# Patient Record
Sex: Female | Born: 1990 | Race: Black or African American | Hispanic: No | Marital: Married | Smoking: Former smoker
Health system: Southern US, Community
[De-identification: ages and names within clinical notes are randomized; demographics above are authoritative.]

## PROBLEM LIST (undated history)

## (undated) ENCOUNTER — Inpatient Hospital Stay (HOSPITAL_COMMUNITY): Payer: Self-pay

## (undated) DIAGNOSIS — Z789 Other specified health status: Secondary | ICD-10-CM

---

## 2015-04-27 DIAGNOSIS — Z98891 History of uterine scar from previous surgery: Secondary | ICD-10-CM | POA: Insufficient documentation

## 2015-10-30 DIAGNOSIS — O99820 Streptococcus B carrier state complicating pregnancy: Secondary | ICD-10-CM | POA: Insufficient documentation

## 2016-08-04 ENCOUNTER — Emergency Department (HOSPITAL_COMMUNITY)
Admission: EM | Admit: 2016-08-04 | Discharge: 2016-08-04 | Disposition: A | Discharge status: Home / Self Care | Payer: Medicaid Other | Attending: Dermatology | Admitting: Dermatology

## 2016-08-04 ENCOUNTER — Encounter (HOSPITAL_COMMUNITY): Payer: Self-pay | Admitting: Emergency Medicine

## 2016-08-04 DIAGNOSIS — Y929 Unspecified place or not applicable: Secondary | ICD-10-CM | POA: Insufficient documentation

## 2016-08-04 DIAGNOSIS — Z5321 Procedure and treatment not carried out due to patient leaving prior to being seen by health care provider: Secondary | ICD-10-CM | POA: Insufficient documentation

## 2016-08-04 DIAGNOSIS — M25551 Pain in right hip: Secondary | ICD-10-CM | POA: Insufficient documentation

## 2016-08-04 DIAGNOSIS — W19XXXA Unspecified fall, initial encounter: Secondary | ICD-10-CM | POA: Insufficient documentation

## 2016-08-04 DIAGNOSIS — Y939 Activity, unspecified: Secondary | ICD-10-CM | POA: Insufficient documentation

## 2016-08-04 DIAGNOSIS — Y999 Unspecified external cause status: Secondary | ICD-10-CM | POA: Diagnosis not present

## 2016-08-04 NOTE — ED Triage Notes (Signed)
Pt complaint of right hip pain post fall 2200 yesterday post mopping stairs. Pt ambulatory to triage.

## 2016-08-04 NOTE — ED Notes (Addendum)
Patient states she no longer wants to wait to be seen. Patient left hospital.

## 2016-11-12 ENCOUNTER — Emergency Department (HOSPITAL_COMMUNITY)
Admission: EM | Admit: 2016-11-12 | Discharge: 2016-11-12 | Disposition: A | Discharge status: Home / Self Care | Payer: Medicaid Other | Attending: Emergency Medicine | Admitting: Emergency Medicine

## 2016-11-12 ENCOUNTER — Emergency Department (HOSPITAL_COMMUNITY): Payer: Medicaid Other

## 2016-11-12 ENCOUNTER — Encounter (HOSPITAL_COMMUNITY): Payer: Self-pay | Admitting: Emergency Medicine

## 2016-11-12 DIAGNOSIS — Y929 Unspecified place or not applicable: Secondary | ICD-10-CM | POA: Diagnosis not present

## 2016-11-12 DIAGNOSIS — Y999 Unspecified external cause status: Secondary | ICD-10-CM | POA: Insufficient documentation

## 2016-11-12 DIAGNOSIS — Y939 Activity, unspecified: Secondary | ICD-10-CM | POA: Insufficient documentation

## 2016-11-12 DIAGNOSIS — X501XXA Overexertion from prolonged static or awkward postures, initial encounter: Secondary | ICD-10-CM | POA: Insufficient documentation

## 2016-11-12 DIAGNOSIS — M25561 Pain in right knee: Secondary | ICD-10-CM | POA: Diagnosis not present

## 2016-11-12 DIAGNOSIS — F17210 Nicotine dependence, cigarettes, uncomplicated: Secondary | ICD-10-CM | POA: Insufficient documentation

## 2016-11-12 MED ORDER — ACETAMINOPHEN 500 MG PO TABS
1000.0000 mg | ORAL_TABLET | Freq: Once | ORAL | Status: AC
  Administered 2016-11-12: 1000 mg via ORAL
  Filled 2016-11-12: qty 2

## 2016-11-12 NOTE — ED Notes (Signed)
Returned from xray

## 2016-11-12 NOTE — ED Triage Notes (Signed)
Pt sts right knee pain and "popping" x 3 days

## 2016-11-12 NOTE — Discharge Instructions (Signed)
Your knee x-rays did not show fractures of dislocations.  I suspect you temporarily dislocated your knee cap, hearing your history and exam findings.   Please take 600 mg ibuprofen for pain every 6-8 hours.  If you need additional pain control, please take 1000  mg of tylenol additionally, every 6-8 hours.  Ice. Rest. Elevate. Avoid activities that exacerbate the pain.  Wear your knee brace to provide support.    Contact orthopedist for an appointment within 7-10 days if your knee continues to pop out or causes you pain.  The orthopedist may recommend an MRI for further work up.  Return to the ED if your pain worsens and prohibits your ability to walk, you have numbness or weakness in your lower leg, have fever, rash or significant swelling of your joint.

## 2016-11-12 NOTE — ED Provider Notes (Signed)
[facility] Provider Note   CSN: [id number] Arrival date & time: 11/12/16  2130   By signing my name below, I, Paula Patton, attest that this documentation has been prepared under the direction and in the presence of Paula Heck, PA-C. Electronically Signed: Clarisse Patton, Scribe. 11/12/16. 10:51 AM.   History   Chief Complaint Chief Complaint  Patient presents with  . Knee Pain   The history is provided by the patient and medical records. No language interpreter was used.    Paula Patton is a 26 y.o. female with no pertinent PMHx on file, transported via private vehicle to the Emergency Department with concern for R knee pain s/p twisting motion 3 days ago. States her knee popped out of place laterally when she twisted from a planted position, causing her to fall and her knee stayed out of place for 1-2 minutes before returning to its original position without intervention. Pt adds her knee has popped out of place 2 times since then. Associated knee swelling, occasional R knee pain radiates to leg and foot with tingling and sensation of instability while walking. She describes waxing and waning, throbbing, aching pain from the knee down, worsened with extension of the leg and ambulation. Pain mildly relieved with ice and ibuprofen, ibuprofen last taken ~ 6:30 AM this morning. States right knee has popped out of place once before 1 year ago with no complication noted since. Pt notes she takes depo-provera shots. No leg swelling, calf pain or swelling, h/o trauma to the area or any other complaints noted at this time.    History reviewed. No pertinent past medical history.  There are no active problems to display for this patient.   History reviewed. No pertinent surgical history.  OB History    No data available       Home Medications    Prior to Admission medications   Not on File    Family History History reviewed. No pertinent family history.  Social  History Social History  Substance Use Topics  . Smoking status: Current Every Day Smoker    Packs/day: 0.50    Types: Cigarettes  . Smokeless tobacco: Not on file  . Alcohol use No     Allergies   Patient has no known allergies.   Review of Systems Review of Systems  Cardiovascular: Negative for leg swelling.  Musculoskeletal: Positive for arthralgias and joint swelling.  Skin: Negative for wound.  Neurological:       +paresthesias in R foot   All other systems reviewed and are negative.    Physical Exam Updated Vital Signs BP (!) 124/94 (BP Location: Left Arm)   Pulse (!) 109   Temp 98.6 F (37 C) (Oral)   Resp 20   SpO2 98%   Physical Exam  Constitutional: She is oriented to person, place, and time. She appears well-developed and well-nourished.  HENT:  Head: Normocephalic and atraumatic.  Eyes: Conjunctivae are normal. Pupils are equal, round, and reactive to light. Right eye exhibits no discharge. Left eye exhibits no discharge. No scleral icterus.  Neck: Normal range of motion. No JVD present. No tracheal deviation present.  Pulmonary/Chest: Effort normal. No stridor.  Musculoskeletal: She exhibits edema and tenderness.  RIGHT KNEE +Mild medial knee edema without erythema, fluctuance or signs of cellulitis.  + Full passive ROM of knees with normal patellar J tracking bilaterally, pain with knee flexion and extension +Medial joint line tenderness +Tenderness over MCL + Positive McMurray's (medial and lateral) +  Varus laxity  No lateral joint line tenderness.   No bony tenderness over fibular head or tibial tuberosity.   No tenderness over LCL or quadriceps tendon.    Negative Lachman's.  Negative posterior drawer test.   Negative ballottement test.  No valgus laxity.  No crepitus with knee ROM.  Patient able to bear weight in ED (4+ steps)  Neurological: She is alert and oriented to person, place, and time. Coordination normal.  5/5 strength with knee  extension and flexion 5/5 strength with ankle dorsiflexion and plantar flexion Sensation to light touch intact in the distribution of the femoral nerve, common fibular nerve.   Psychiatric: She has a normal mood and affect. Her behavior is normal. Judgment and thought content normal.  Nursing note and vitals reviewed.    ED Treatments / Results  DIAGNOSTIC STUDIES: Oxygen Saturation is 98% on RA, NL by my interpretation.    COORDINATION OF CARE: 11:34 AM-Discussed next steps with pt. Pt verbalized understanding and is agreeable with the plan. Will order imaging.   Labs (all labs ordered are listed, but only abnormal results are displayed) Labs Reviewed - No data to display  EKG  EKG Interpretation None       Radiology Dg Knee Complete 4 Views Right  Result Date: 11/12/2016 CLINICAL DATA:  Knee popped out of place 3 times.  Right knee pain. EXAM: RIGHT KNEE - COMPLETE 4+ VIEW COMPARISON:  None. FINDINGS: No evidence of fracture, dislocation, or joint effusion. The patella appears normally located on the submitted views. IMPRESSION: Negative. Electronically Signed   By: Marnee Spring M.D.   On: 11/12/2016 11:22    Procedures Procedures (including critical care time)  Medications Ordered in ED Medications  acetaminophen (TYLENOL) tablet 1,000 mg (1,000 mg Oral Given 11/12/16 1103)     Initial Impression / Assessment and Plan / ED Course  I have reviewed the triage vital signs and the nursing notes.  Pertinent labs & imaging results that were available during my care of the patient were reviewed by me and considered in my medical decision making (see chart for details).  Clinical Course as of Nov 12 1132  Wed Nov 12, 2016  1126 FINDINGS: No evidence of fracture, dislocation, or joint effusion. The patella appears normally located on the submitted views. DG Knee Complete 4 Views Right [CG]    Clinical Course User Index [CG] Liberty Handy, PA-C    26 yo female  presents to ED with acute right knee pain s/p sensation of "knee popping out of place", this has happened 3 times in the last 3 days.  Patient has h/o of same one year ago, but pain was not this severe.  On exam there is tenderness to patella, MCL and medial joint line. Slight varus laxity.  Positive McMurray's medially and laterally.  No overlaying skin changes to suggest septic arthritis, cellulitis, osteomyelitis or gout.  Lower extremity is neurovascularly intact.  X-rays negative today.  Suspect soft tissue injury, will likely need MRI if symptoms progress or pain is not improved.  Will discharge with NSAIDs, ice, rest, elevate and orthopedic f/u.  Discussed x-rays results with patient who is agreeable to plan.   Final Clinical Impressions(s) / ED Diagnoses   Final diagnoses:  Acute pain of right knee    New Prescriptions New Prescriptions   No medications on file   I personally performed the services described in this documentation, which was scribed in my presence. The recorded information has been reviewed  and is accurate.    Liberty Handy, PA-C 11/12/16 1134    Raeford Razor, MD 11/19/16 [phone]

## 2016-11-12 NOTE — ED Notes (Signed)
Patient transported to X-ray 

## 2016-12-17 ENCOUNTER — Emergency Department
Admission: EM | Admit: 2016-12-17 | Discharge: 2016-12-17 | Disposition: A | Discharge status: Home / Self Care | Payer: Medicaid Other | Attending: Emergency Medicine | Admitting: Emergency Medicine

## 2016-12-17 ENCOUNTER — Encounter: Payer: Self-pay | Admitting: *Deleted

## 2016-12-17 ENCOUNTER — Emergency Department: Payer: Medicaid Other

## 2016-12-17 DIAGNOSIS — M25561 Pain in right knee: Secondary | ICD-10-CM

## 2016-12-17 DIAGNOSIS — Y999 Unspecified external cause status: Secondary | ICD-10-CM | POA: Insufficient documentation

## 2016-12-17 DIAGNOSIS — Y929 Unspecified place or not applicable: Secondary | ICD-10-CM | POA: Insufficient documentation

## 2016-12-17 DIAGNOSIS — Y939 Activity, unspecified: Secondary | ICD-10-CM | POA: Diagnosis not present

## 2016-12-17 DIAGNOSIS — S83001A Unspecified subluxation of right patella, initial encounter: Secondary | ICD-10-CM | POA: Insufficient documentation

## 2016-12-17 DIAGNOSIS — G8929 Other chronic pain: Secondary | ICD-10-CM | POA: Diagnosis not present

## 2016-12-17 DIAGNOSIS — X509XXA Other and unspecified overexertion or strenuous movements or postures, initial encounter: Secondary | ICD-10-CM | POA: Diagnosis not present

## 2016-12-17 DIAGNOSIS — F17210 Nicotine dependence, cigarettes, uncomplicated: Secondary | ICD-10-CM | POA: Insufficient documentation

## 2016-12-17 MED ORDER — NAPROXEN 500 MG PO TABS
500.0000 mg | ORAL_TABLET | Freq: Once | ORAL | Status: AC
  Administered 2016-12-17: 500 mg via ORAL

## 2016-12-17 MED ORDER — NAPROXEN 500 MG PO TABS: 500.0000 mg | ORAL_TABLET | Freq: Two times a day (BID) | ORAL | Status: DC

## 2016-12-17 MED ORDER — NAPROXEN 500 MG PO TABS
ORAL_TABLET | ORAL | Status: AC
  Filled 2016-12-17: qty 1

## 2016-12-17 NOTE — ED Triage Notes (Addendum)
Pt reports her right knee was dislocated 3 weeks ago and today right knee popped out again and caused pain.  Pt ambulates with a limp.  Pt requesting a mri.

## 2016-12-17 NOTE — ED Provider Notes (Signed)
[facility] Emergency Department Provider Note   ____________________________________________   None    (approximate)  I have reviewed the triage vital signs and the nursing notes.   HISTORY  Chief Complaint Knee Pain    HPI Paula Patton is a 26 y.o. female patient complaining of continue right knee pain status post subluxation of patella 3 weeks ago. Patient state continues to walk with atypical gait.patient rates the pain as a 7/10. Patient described a pain as "achy".   No past medical history on file.  There are no active problems to display for this patient.   No past surgical history on file.  Prior to Admission medications   Not on File    Allergies Patient has no known allergies.  No family history on file.  Social History Social History  Substance Use Topics  . Smoking status: Current Every Day Smoker    Packs/day: 0.50    Types: Cigarettes  . Smokeless tobacco: Never Used  . Alcohol use No    Review of Systems  Constitutional: No fever/chills Eyes: No visual changes. ENT: No sore throat. Cardiovascular: Denies chest pain. Respiratory: Denies shortness of breath. Gastrointestinal: No abdominal pain.  No nausea, no vomiting.  No diarrhea.  No constipation. Genitourinary: Negative for dysuria. Musculoskeletal: right knee pain Skin: Negative for rash. Neurological: Negative for headaches, focal weakness or numbness.   ____________________________________________   PHYSICAL EXAM:  VITAL SIGNS: ED Triage Vitals  Enc Vitals Group     BP 12/17/16 1939 116/68     Pulse Rate 12/17/16 1939 78     Resp 12/17/16 1939 18     Temp 12/17/16 1939 98.4 F (36.9 C)     Temp Source 12/17/16 1939 Oral     SpO2 12/17/16 1939 99 %     Weight 12/17/16 1936 140 lb (63.5 kg)     Height 12/17/16 1936 5' (1.524 m)     Head Circumference --      Peak Flow --      Pain Score 12/17/16 1936 7     Pain Loc --      Pain Edu? --        Excl. in GC? --     Constitutional: Alert and oriented. Well appearing and in no acute distress. Eyes: Conjunctivae are normal. PERRL. EOMI. Head: Atraumatic. Nose: No congestion/rhinnorhea. Mouth/Throat: Mucous membranes are moist.  Oropharynx non-erythematous. Neck: No stridor.  No cervical spine tenderness to palpation. Hematological/Lymphatic/Immunilogical: No cervical lymphadenopathy. Cardiovascular: Normal rate, regular rhythm. Grossly normal heart sounds.  Good peripheral circulation. Respiratory: Normal respiratory effort.  No retractions. Lungs CTAB. Gastrointestinal: Soft and nontender. No distention. No abdominal bruits. No CVA tenderness. Musculoskeletal: no obvious deformity to the right knee. Patient has mild crepitus to palpation. Patient has LCL laxity with stress testing. Neurologic:  Normal speech and language. No gross focal neurologic deficits are appreciated. No gait instability. Skin:  Skin is warm, dry and intact. No rash noted. Psychiatric: Mood and affect are normal. Speech and behavior are normal.  ____________________________________________   LABS (all labs ordered are listed, but only abnormal results are displayed)  Labs Reviewed - No data to display ____________________________________________  EKG   ____________________________________________  RADIOLOGY  CT shows slight subluxation without frank dislocation the right patella. ____________________________________________   PROCEDURES  Procedure(s) performed: None  Procedures  Critical Care performed: No  ____________________________________________   INITIAL IMPRESSION / ASSESSMENT AND PLAN / ED COURSE  Pertinent labs & imaging results that were  available during my care of the patient were reviewed by me and considered in my medical decision making (see chart for details).  Chronic right knee pain secondary to slight subluxation of the patella without frank dislocation.  Discussed x-ray finding with patient. Patient advised follow-up with orthopedics at home's dictation for definitive evaluation and treatment.      ____________________________________________   FINAL CLINICAL IMPRESSION(S) / ED DIAGNOSES  Final diagnoses:  Chronic pain of right knee      NEW MEDICATIONS STARTED DURING THIS VISIT:  New Prescriptions   No medications on file     Note:  This document was prepared using Dragon voice recognition software and may include unintentional dictation errors.    Joni ReiningSmith, Aiya Keach K, PA-C 12/17/16 2138    Rockne MenghiniNorman, Anne-Caroline, MD 12/17/16 [phone]

## 2016-12-17 NOTE — Discharge Instructions (Signed)
Wear knee support until evaluation by orthopedics.

## 2017-01-22 ENCOUNTER — Ambulatory Visit (HOSPITAL_COMMUNITY)
Admission: EM | Admit: 2017-01-22 | Discharge: 2017-01-22 | Disposition: A | Discharge status: Home / Self Care | Payer: Medicaid Other | Attending: Family Medicine | Admitting: Family Medicine

## 2017-01-22 ENCOUNTER — Encounter (HOSPITAL_COMMUNITY): Payer: Self-pay | Admitting: Family Medicine

## 2017-01-22 DIAGNOSIS — R319 Hematuria, unspecified: Secondary | ICD-10-CM | POA: Diagnosis not present

## 2017-01-22 DIAGNOSIS — N390 Urinary tract infection, site not specified: Secondary | ICD-10-CM | POA: Diagnosis not present

## 2017-01-22 LAB — POCT URINALYSIS DIP (DEVICE)
GLUCOSE, UA: 100 mg/dL — AB
Nitrite: NEGATIVE
Specific Gravity, Urine: >=1.03 (ref 1.005–1.030)
Urobilinogen, UA: 1 mg/dL (ref 0.0–1.0)
pH: 6.5 (ref 5.0–8.0)

## 2017-01-22 LAB — POCT PREGNANCY, URINE: Preg Test, Ur: NEGATIVE

## 2017-01-22 MED ORDER — SULFAMETHOXAZOLE-TRIMETHOPRIM 800-160 MG PO TABS: 1.0000 | ORAL_TABLET | Freq: Two times a day (BID) | ORAL | 0 refills | Status: AC

## 2017-01-22 MED ORDER — PHENAZOPYRIDINE HCL 200 MG PO TABS: 200.0000 mg | ORAL_TABLET | Freq: Three times a day (TID) | ORAL | 0 refills | Status: DC

## 2017-01-22 NOTE — ED Triage Notes (Signed)
Pt here for urinary frequency, burning and hematuria,

## 2017-01-22 NOTE — ED Provider Notes (Signed)
  [facility]    ASSESSMENT & PLAN:  Today you were diagnosed with the following: 1. Urinary tract infection with hematuria, site unspecified    You have been prescribed prescription medications this visit.   Meds ordered this encounter  Medications  . sulfamethoxazole-trimethoprim (BACTRIM DS,SEPTRA DS) 800-160 MG tablet    Sig: Take 1 tablet by mouth 2 (two) times daily.    Dispense:  10 tablet    Refill:  0  . phenazopyridine (PYRIDIUM) 200 MG tablet    Sig: Take 1 tablet (200 mg total) by mouth 3 (three) times daily.    Dispense:  6 tablet    Refill:  0   If you are not improving over the next few days or feel you are worsening please follow up here or the Emergency Department if you are unable to see your regular doctor.  We have send a urine culture and will contact you when the results are available. Please do your best to ensure adequate fluid intake.  If you are not improving over the next few days or feel you are worsening please follow up here or the Emergency Department if you are unable to see your regular doctor.  Outlined signs and symptoms indicating need for more acute intervention. Call or return to clinic if these symptoms worsen or fail to improve as anticipated. Patient verbalized understanding. After Visit Summary given.  SUBJECTIVE:  Paula Patton is a 26 y.o. female who complains of urinary frequency, urgency and dysuria for the past 1 day, without flank pain, fever, chills, abnormal vaginal discharge or bleeding. Does report blood in urine. Normal PO intake. No flank or abdominal pain. No self treatment.  OBJECTIVE:  Vitals:   01/22/17 1510  BP: 106/70  Pulse: 86  Resp: 17  Temp: 98.4 F (36.9 C)  SpO2: 100%   Appears well, in no apparent distress. Abdomen is soft without tenderness, guarding, mass, rebound or organomegaly. No CVA tenderness or inguinal adenopathy noted.  Labs Reviewed  POCT URINALYSIS DIP (DEVICE) - Abnormal;  Notable for the following:       Result Value   Glucose, UA 100 (*)    Bilirubin Urine SMALL (*)    Ketones, ur TRACE (*)    Hgb urine dipstick LARGE (*)    Protein, ur >=300 (*)    Leukocytes, UA LARGE (*)    All other components within normal limits   PMH reviewed.    Mardella LaymanHagler, Paula Hall, MD 01/22/17 [phone]

## 2017-01-22 NOTE — Discharge Instructions (Addendum)
Today you were diagnosed with the following: 1. Urinary tract infection with hematuria, site unspecified    You have been prescribed prescription medications this visit.   Meds ordered this encounter  Medications   sulfamethoxazole-trimethoprim (BACTRIM DS,SEPTRA DS) 800-160 MG tablet    Sig: Take 1 tablet by mouth 2 (two) times daily.    Dispense:  10 tablet    Refill:  0   phenazopyridine (PYRIDIUM) 200 MG tablet    Sig: Take 1 tablet (200 mg total) by mouth 3 (three) times daily.    Dispense:  6 tablet    Refill:  0   We will call you with the results of your urine culture.  If you are not improving over the next few days or feel you are worsening please follow up here or the Emergency Department if you are unable to see your regular doctor.

## 2017-08-21 ENCOUNTER — Emergency Department (HOSPITAL_BASED_OUTPATIENT_CLINIC_OR_DEPARTMENT_OTHER)
Admit: 2017-08-21 | Discharge: 2017-08-21 | Disposition: A | Discharge status: Home / Self Care | Payer: 59 | Attending: Emergency Medicine | Admitting: Emergency Medicine

## 2017-08-21 ENCOUNTER — Emergency Department (HOSPITAL_COMMUNITY)
Admission: EM | Admit: 2017-08-21 | Discharge: 2017-08-21 | Disposition: A | Discharge status: Home / Self Care | Payer: 59 | Attending: Emergency Medicine | Admitting: Emergency Medicine

## 2017-08-21 ENCOUNTER — Encounter (HOSPITAL_COMMUNITY): Payer: Self-pay | Admitting: Emergency Medicine

## 2017-08-21 ENCOUNTER — Emergency Department (HOSPITAL_COMMUNITY): Payer: 59

## 2017-08-21 ENCOUNTER — Other Ambulatory Visit: Payer: Self-pay

## 2017-08-21 DIAGNOSIS — Z79899 Other long term (current) drug therapy: Secondary | ICD-10-CM | POA: Insufficient documentation

## 2017-08-21 DIAGNOSIS — M79609 Pain in unspecified limb: Secondary | ICD-10-CM | POA: Diagnosis not present

## 2017-08-21 DIAGNOSIS — F17210 Nicotine dependence, cigarettes, uncomplicated: Secondary | ICD-10-CM | POA: Diagnosis not present

## 2017-08-21 DIAGNOSIS — M25561 Pain in right knee: Secondary | ICD-10-CM | POA: Insufficient documentation

## 2017-08-21 MED ORDER — NAPROXEN 500 MG PO TABS: 500.0000 mg | ORAL_TABLET | Freq: Two times a day (BID) | ORAL | Status: DC

## 2017-08-21 MED ORDER — NAPROXEN 250 MG PO TABS
500.0000 mg | ORAL_TABLET | Freq: Once | ORAL | Status: AC
  Administered 2017-08-21: 500 mg via ORAL
  Filled 2017-08-21: qty 2

## 2017-08-21 NOTE — Discharge Instructions (Signed)
You were seen in the emergency department for pain and swelling in your right knee.  Your x-ray did not show any fracture or dislocation. Your ultrasound did not show a blood clot in your leg.   I have prescribed you naproxen this is a nonsteroidal anti-inflammatory medication.  You may take this once as needed every 12 hours for pain and swelling, be sure to take this with food as it can cause stomach upset and at worst stomach bleeding.  Do not take other nonsteroidal anti-inflammatories while taking this medicine including Goody powder, Advil, Aleve, or Motrin.  You may supplement with Tylenol.   Follow-up with your orthopedic doctor or the orthopedic doctor that I have provided in your discharge instructions within 1 week if you have not had improvement in your symptoms.  Return to the emergency department for any new or worsening symptoms including but not limited to fever, chills, numbness, weakness, inability to walk, redness of the knee, increase the pain or swelling.

## 2017-08-21 NOTE — ED Provider Notes (Signed)
[facility] EMERGENCY DEPARTMENT Provider Note   CSN: [id number] Arrival date & time: 08/21/17  1014     History   Chief Complaint Chief Complaint  Patient presents with  . Knee Pain    HPI Paula Patton is a 27 y.o. female with a hx of tobacco abuse who presents to the ED complaining of acute on chronic R knee pain x 1 week. Patient states she has a hx of patellar dislocation and was informed she had minimal cartilage remaining in her knee in October 2018- states since then she has had intermittent problems with discomfort. States she has been seeing [facility] orthopedics for chronic management, but was unable to get to the office for an appointment. States she has had discomfort x 1 week, rates the pain an 8.5 in severity that worsens with movement and walking. Patient states she has tried ibuprofen without substantial relief. State that this pain feels different in the sense that it is more severe and radiating into calf and upper leg. Reports associated knee swelling. Denies numbness, weakness, fever, or chills. States she receives Deppo shots for birth control. Denies recent surgery/trauma, recent long travel, personal hx of cancer, or hx of DVT/PE.   HPI  History reviewed. No pertinent past medical history.  There are no active problems to display for this patient.   History reviewed. No pertinent surgical history.  OB History    No data available       Home Medications    Prior to Admission medications   Medication Sig Start Date End Date Taking? Authorizing Provider  naproxen (NAPROSYN) 500 MG tablet Take 1 tablet (500 mg total) by mouth 2 (two) times daily with a meal. 12/17/16   Joni Reining, PA-C  phenazopyridine (PYRIDIUM) 200 MG tablet Take 1 tablet (200 mg total) by mouth 3 (three) times daily. 01/22/17   Mardella Layman, MD    Family History History reviewed. No pertinent family history.  Social History Social History   Tobacco Use  . Smoking  status: Current Every Day Smoker    Packs/day: 0.50    Types: Cigarettes  . Smokeless tobacco: Never Used  Substance Use Topics  . Alcohol use: No  . Drug use: No     Allergies   Patient has no known allergies.   Review of Systems Review of Systems  Constitutional: Negative for chills and fever.  Musculoskeletal: Positive for arthralgias (R knee), joint swelling (R knee) and myalgias (RLE).  Skin: Negative for color change.  Neurological: Negative for weakness and numbness.    Physical Exam Updated Vital Signs BP 105/78 (BP Location: Right Arm)   Pulse 77   Temp 98.8 F (37.1 C) (Oral)   Resp 16   SpO2 99%   Physical Exam  Constitutional: She appears well-developed and well-nourished. No distress.  HENT:  Head: Normocephalic and atraumatic.  Eyes: Conjunctivae are normal. Right eye exhibits no discharge. Left eye exhibits no discharge.  Cardiovascular:  Pulses:      Dorsalis pedis pulses are 2+ on the right side, and 2+ on the left side.  Musculoskeletal: She exhibits no edema.  Lower extremities: No obvious deformity, appreciable swelling, erythema, ecchymosis, or warmth.  Patient has full range of motion to bilateral hips and ankles.  She has full range of motion to the left knee, right knee flexion is limited secondary to pain.  Patient is diffusely tender to the right knee which extends slightly proximally to the distal femur.  She has  diffuse calf tenderness to palpation.  Neurological: She is alert.  Clear speech.  Sensation grossly intact to bilateral lower extremities. 5/5 strength with plantar/dorsi flexion bilaterally. Gait is antalgic.   Skin: Skin is warm and dry.  Psychiatric: She has a normal mood and affect. Her behavior is normal. Thought content normal.  Nursing note and vitals reviewed.   ED Treatments / Results  Labs (all labs ordered are listed, but only abnormal results are displayed) Labs Reviewed - No data to display  EKG  EKG  Interpretation None       Radiology Dg Knee Complete 4 Views Right  Result Date: 08/21/2017 CLINICAL DATA:  Knee pain for several months. EXAM: RIGHT KNEE - COMPLETE 4+ VIEW COMPARISON:  CT 12/17/2016. FINDINGS: No acute bony or joint abnormality identified. No evidence of fracture or dislocation. IMPRESSION: No acute abnormality. Electronically Signed   By: Maisie Fushomas  Register   On: 08/21/2017 12:46    Procedures Procedures (including critical care time)  Medications Ordered in ED Medications  naproxen (NAPROSYN) tablet 500 mg (not administered)     Initial Impression / Assessment and Plan / ED Course  I have reviewed the triage vital signs and the nursing notes.  Pertinent labs & imaging results that were available during my care of the patient were reviewed by me and considered in my medical decision making (see chart for details).    Patient presents with right knee pain/swelling.  Patient is nontoxic appearing, in no apparent distress, vitals are WNL. Patient with mildly restricted range of motion secondary to pain- unable to perform full flexion. She is diffusely tender to the R knee extending to R calf region. X-ray negative for acute fracture or dislocation.  US negative for DVT.  Patient is without systemic symptoms, erythema, or redness of the joint consistent with gout or septic joint. Will prescribe naproxen with orthopedics follow up. I discussed results, treatment plan, need for ortho follow-up, and return precautions with the patient. Provided opportunity for questions, patient confirmed understanding and is in agreement with plan.   Final Clinical Impressions(s) / ED Diagnoses   Final diagnoses:  Acute pain of right knee    ED Discharge Orders        Ordered    naproxen (NAPROSYN) 500 MG tablet  2 times daily with meals     08/21/17 1253       Mairany Bruno, LyndenSamantha R, PA-C 08/21/17 1337    Terrilee FilesButler, Michael C, MD 08/22/17 [phone]

## 2017-08-21 NOTE — ED Triage Notes (Signed)
States knows she has no cartilage in her rt knee and [facility], pain is bad  On and off x 1 week and she cannot make it there and doesn't like the drs here, wearing a brace states aching goes down leg to ankle, is on birth control shot

## 2017-08-21 NOTE — Progress Notes (Signed)
Right lower extremity venous duplex has been completed. Negative for DVT. [facility]  08/21/17 11:58 AM Olen CordialGreg Inman Fettig RVT

## 2017-09-05 ENCOUNTER — Emergency Department (HOSPITAL_COMMUNITY)
Admission: EM | Admit: 2017-09-05 | Discharge: 2017-09-05 | Disposition: A | Discharge status: Home / Self Care | Payer: 59 | Attending: Emergency Medicine | Admitting: Emergency Medicine

## 2017-09-05 ENCOUNTER — Encounter (HOSPITAL_COMMUNITY): Payer: Self-pay

## 2017-09-05 ENCOUNTER — Other Ambulatory Visit: Payer: Self-pay

## 2017-09-05 ENCOUNTER — Emergency Department (HOSPITAL_COMMUNITY): Payer: 59

## 2017-09-05 DIAGNOSIS — J111 Influenza due to unidentified influenza virus with other respiratory manifestations: Secondary | ICD-10-CM

## 2017-09-05 DIAGNOSIS — Z79899 Other long term (current) drug therapy: Secondary | ICD-10-CM | POA: Diagnosis not present

## 2017-09-05 DIAGNOSIS — F17210 Nicotine dependence, cigarettes, uncomplicated: Secondary | ICD-10-CM | POA: Insufficient documentation

## 2017-09-05 DIAGNOSIS — R05 Cough: Secondary | ICD-10-CM | POA: Diagnosis present

## 2017-09-05 MED ORDER — IBUPROFEN 800 MG PO TABS: 800.0000 mg | ORAL_TABLET | Freq: Three times a day (TID) | ORAL | 0 refills | Status: DC | PRN

## 2017-09-05 MED ORDER — IBUPROFEN 800 MG PO TABS
800.0000 mg | ORAL_TABLET | Freq: Once | ORAL | Status: AC
  Administered 2017-09-05: 800 mg via ORAL
  Filled 2017-09-05: qty 1

## 2017-09-05 MED ORDER — ACETAMINOPHEN-CODEINE 120-12 MG/5ML PO SOLN: 10.0000 mL | ORAL | 0 refills | Status: DC | PRN

## 2017-09-05 MED ORDER — ACETAMINOPHEN 325 MG PO TABS
650.0000 mg | ORAL_TABLET | Freq: Once | ORAL | Status: AC | PRN
  Administered 2017-09-05: 650 mg via ORAL
  Filled 2017-09-05: qty 2

## 2017-09-05 MED ORDER — GUAIFENESIN ER 1200 MG PO TB12: 1.0000 | ORAL_TABLET | Freq: Two times a day (BID) | ORAL | 0 refills | Status: DC

## 2017-09-05 NOTE — Discharge Instructions (Signed)
Return here as needed.  Rest as much as possible.  Increase your fluid intake.  Tylenol and the Motrin for fever

## 2017-09-05 NOTE — ED Triage Notes (Signed)
Patient complains of 2 days of cough, congestion, fever and body aches. Alert and oriented. Mask placed on arrival

## 2017-09-05 NOTE — ED Provider Notes (Signed)
[facility]   CSN: [id number] Arrival date & time: 09/05/17  1334     History   Chief Complaint No chief complaint on file.   HPI Paula Patton is a 27 y.o. female.  HPI Patient presents to the emergency department with cough, body aches, fever, nasal congestion, sore throat, and ear pain.  The symptoms started 2 days ago.  The patient states that nothing seems to make the condition better or worse.  Patient states that she does not know of any sick contacts.  States she does have small children at home.  The patient denies chest pain, shortness of breath, headache,blurred vision, neck pain, weakness, numbness, dizziness, anorexia, edema, abdominal pain, nausea, vomiting, diarrhea, rash, back pain, dysuria, hematemesis, bloody stool, near syncope, or syncope. History reviewed. No pertinent past medical history.  There are no active problems to display for this patient.   History reviewed. No pertinent surgical history.  OB History    No data available       Home Medications    Prior to Admission medications   Medication Sig Start Date End Date Taking? Authorizing Provider  naproxen (NAPROSYN) 500 MG tablet Take 1 tablet (500 mg total) by mouth 2 (two) times daily with a meal. 08/21/17   Petrucelli, Samantha R, PA-C  phenazopyridine (PYRIDIUM) 200 MG tablet Take 1 tablet (200 mg total) by mouth 3 (three) times daily. 01/22/17   Mardella LaymanHagler, Brian, MD    Family History No family history on file.  Social History Social History   Tobacco Use  . Smoking status: Current Every Day Smoker    Packs/day: 0.50    Types: Cigarettes  . Smokeless tobacco: Never Used  Substance Use Topics  . Alcohol use: No  . Drug use: No     Allergies   Patient has no known allergies.   Review of Systems Review of Systems All other systems negative except as documented in the HPI. All pertinent positives and negatives as reviewed in the  HPI.  Physical Exam Updated Vital Signs BP 123/77   Pulse (!) 103   Temp 98.8 F (37.1 C) (Oral)   Resp (!) 0   SpO2 99%   Physical Exam  Constitutional: She is oriented to person, place, and time. She appears well-developed and well-nourished. No distress.  HENT:  Head: Normocephalic and atraumatic.  Mouth/Throat: Oropharynx is clear and moist. No oropharyngeal exudate.  Eyes: Pupils are equal, round, and reactive to light.  Neck: Normal range of motion. Neck supple.  Cardiovascular: Normal rate, regular rhythm and normal heart sounds. Exam reveals no gallop and no friction rub.  No murmur heard. Pulmonary/Chest: Effort normal and breath sounds normal. No respiratory distress. She has no wheezes.  Abdominal: Soft. Bowel sounds are normal. She exhibits no distension. There is no tenderness.  Neurological: She is alert and oriented to person, place, and time. She exhibits normal muscle tone. Coordination normal.  Skin: Skin is warm and dry. Capillary refill takes less than 2 seconds. No rash noted. No erythema.  Psychiatric: She has a normal mood and affect. Her behavior is normal.  Nursing note and vitals reviewed.    ED Treatments / Results  Labs (all labs ordered are listed, but only abnormal results are displayed) Labs Reviewed - No data to display  EKG  EKG Interpretation None       Radiology Dg Chest 2 View  Result Date: 09/05/2017 CLINICAL DATA:  Cough, chest pain EXAM: CHEST  2 VIEW COMPARISON:  None. FINDINGS: The heart size and mediastinal contours are within normal limits. Both lungs are clear. The visualized skeletal structures are unremarkable. IMPRESSION: No active cardiopulmonary disease. Electronically Signed   By: Judie Petit.  Shick M.D.   On: 09/05/2017 14:37    Procedures Procedures (including critical care time)  Medications Ordered in ED Medications  acetaminophen (TYLENOL) tablet 650 mg (650 mg Oral Given 09/05/17 1348)  ibuprofen (ADVIL,MOTRIN) tablet  800 mg (800 mg Oral Given 09/05/17 1631)     Initial Impression / Assessment and Plan / ED Course  I have reviewed the triage vital signs and the nursing notes.  Pertinent labs & imaging results that were available during my care of the patient were reviewed by me and considered in my medical decision making (see chart for details).     Patient has influenza based on HPI and physical exam findings.  Chest x-ray did not show any abnormalities.  Patient will be treated symptomatically and advised to return here as needed.  Patient agrees to the plan and all questions were answered  Final Clinical Impressions(s) / ED Diagnoses   Final diagnoses:  None    ED Discharge Orders    None       Charlestine Night, PA-C 09/05/17 1635    Alvira Monday, MD 09/06/17 1950

## 2017-09-05 NOTE — ED Notes (Signed)
Declined W/C at D/C and was escorted to lobby by RN. 

## 2018-09-08 ENCOUNTER — Emergency Department (HOSPITAL_COMMUNITY)
Admission: EM | Admit: 2018-09-08 | Discharge: 2018-09-08 | Disposition: A | Discharge status: Home / Self Care | Payer: 59 | Attending: Emergency Medicine | Admitting: Emergency Medicine

## 2018-09-08 ENCOUNTER — Emergency Department (HOSPITAL_COMMUNITY): Payer: 59

## 2018-09-08 ENCOUNTER — Encounter (HOSPITAL_COMMUNITY): Payer: Self-pay | Admitting: Emergency Medicine

## 2018-09-08 DIAGNOSIS — Y929 Unspecified place or not applicable: Secondary | ICD-10-CM | POA: Diagnosis not present

## 2018-09-08 DIAGNOSIS — Y999 Unspecified external cause status: Secondary | ICD-10-CM | POA: Diagnosis not present

## 2018-09-08 DIAGNOSIS — M25562 Pain in left knee: Secondary | ICD-10-CM | POA: Diagnosis present

## 2018-09-08 DIAGNOSIS — W1849XA Other slipping, tripping and stumbling without falling, initial encounter: Secondary | ICD-10-CM | POA: Diagnosis not present

## 2018-09-08 DIAGNOSIS — Z79899 Other long term (current) drug therapy: Secondary | ICD-10-CM | POA: Insufficient documentation

## 2018-09-08 DIAGNOSIS — Y9301 Activity, walking, marching and hiking: Secondary | ICD-10-CM | POA: Insufficient documentation

## 2018-09-08 DIAGNOSIS — F17210 Nicotine dependence, cigarettes, uncomplicated: Secondary | ICD-10-CM | POA: Diagnosis not present

## 2018-09-08 MED ORDER — IBUPROFEN 400 MG PO TABS
400.0000 mg | ORAL_TABLET | Freq: Once | ORAL | Status: AC
  Administered 2018-09-08: 400 mg via ORAL
  Filled 2018-09-08: qty 1

## 2018-09-08 NOTE — ED Notes (Signed)
Patient verbalizes understanding of discharge instructions. Opportunity for questioning and answers were provided. Armband removed by staff, pt discharged from ED. Pt wheeled to lobby. 

## 2018-09-08 NOTE — ED Triage Notes (Signed)
Pt states she thinks her L knee dislocated this am. She states she was rolling over in bed when she heard a pop and now feels like it is out of place. Has hx of the same with her R knee.

## 2018-09-08 NOTE — Progress Notes (Signed)
Orthopedic Tech Progress Note Patient Details:  Paula Patton 11-Dec-1990 [id number]  Ortho Devices Type of Ortho Device: Crutches, Knee Immobilizer Ortho Device/Splint Location: LLE Ortho Device/Splint Interventions: Adjustment, Application, Ordered   Post Interventions Patient Tolerated: Well Instructions Provided: Care of device, Adjustment of device, Poper ambulation with device   Donald Pore 09/08/2018, 1:19 PM

## 2018-09-08 NOTE — Discharge Instructions (Addendum)
As discussed, with the possibility of knee dislocation, it is important to keep your immobilizer in place, when walking, and schedule follow-up with your orthopedic physician. Use ibuprofen, 400 mg, 3 times daily for pain control. Return here for concerning changes in your condition.

## 2018-09-08 NOTE — ED Notes (Signed)
Pt advised not to drink anymore of smoothie that she has at bedside.

## 2018-09-08 NOTE — ED Notes (Signed)
Patient transported to X-ray 

## 2018-09-08 NOTE — ED Provider Notes (Signed)
[facility] EMERGENCY DEPARTMENT Provider Note   CSN: [id number] Arrival date & time: 09/08/18  1054    History   Chief Complaint Chief Complaint  Patient presents with  . Knee Pain    HPI Paula Patton is a 28 y.o. female.     HPI Is with left knee pain. Patient has a history of right knee patella dislocation. She notes that earlier today she rolled, felt her left knee have sudden sharp pain, with an audible disturbance. She feels as though her patella was dislocated, though relocated spontaneously prior to ED arrival. She has been ambulatory though she notes a sensation of her knee about to give way while walking. She has pain throughout the knee, primarily in the inferior region, sore, severe, not improved with OTC medication. No fall, no other complaints.  History reviewed. No pertinent past medical history.  There are no active problems to display for this patient.   History reviewed. No pertinent surgical history.   OB History   No obstetric history on file.      Home Medications    Prior to Admission medications   Medication Sig Start Date End Date Taking? Authorizing Provider  acetaminophen-codeine 120-12 MG/5ML solution Take 10 mLs by mouth every 4 (four) hours as needed for moderate pain (And cough). 09/05/17   Lawyer, Cristal Deer, PA-C  Guaifenesin 1200 MG TB12 Take 1 tablet (1,200 mg total) by mouth 2 (two) times daily. 09/05/17   Lawyer, Cristal Deer, PA-C  ibuprofen (ADVIL,MOTRIN) 800 MG tablet Take 1 tablet (800 mg total) by mouth every 8 (eight) hours as needed. 09/05/17   Lawyer, Cristal Deer, PA-C  naproxen (NAPROSYN) 500 MG tablet Take 1 tablet (500 mg total) by mouth 2 (two) times daily with a meal. 08/21/17   Petrucelli, Samantha R, PA-C  phenazopyridine (PYRIDIUM) 200 MG tablet Take 1 tablet (200 mg total) by mouth 3 (three) times daily. 01/22/17   Mardella Layman, MD    Family History No family history on file.  Social  History Social History   Tobacco Use  . Smoking status: Current Every Day Smoker    Packs/day: 0.50    Types: Cigarettes  . Smokeless tobacco: Never Used  Substance Use Topics  . Alcohol use: No  . Drug use: No     Allergies   Patient has no known allergies.   Review of Systems Review of Systems  Constitutional: Negative for fever.  Respiratory: Negative for shortness of breath.   Cardiovascular: Negative for chest pain.  Musculoskeletal:       Negative aside from HPI  Skin:       Negative aside from HPI  Allergic/Immunologic: Negative for immunocompromised state.  Neurological: Negative for weakness.     Physical Exam Updated Vital Signs BP (!) 112/96 (BP Location: Right Arm)   Pulse 81   Temp 98.6 F (37 C) (Oral)   Resp 16   SpO2 100%   Physical Exam Vitals signs and nursing note reviewed.  Constitutional:      General: She is not in acute distress.    Appearance: She is well-developed.  HENT:     Head: Normocephalic and atraumatic.  Eyes:     Conjunctiva/sclera: Conjunctivae normal.  Pulmonary:     Effort: Pulmonary effort is normal.     Breath sounds: No stridor.  Musculoskeletal:     Comments: Patient has appropriate flexion, extension to 1 7 0 degrees, left knee, mild infrapatellar effusion, tenderness to palpation throughout the infrapatellar region Patella  is grossly located appropriately.   Skin:    General: Skin is warm and dry.  Neurological:     Mental Status: She is alert and oriented to person, place, and time.     Cranial Nerves: No cranial nerve deficit.      ED Treatments / Results   Radiology Dg Knee Ap/lat W/sunrise Left  Result Date: 09/08/2018 CLINICAL DATA:  Felt a pop in knee this morning while rolling over in bed. EXAM: LEFT KNEE 3 VIEWS COMPARISON:  None. FINDINGS: The joint spaces are normal. No acute bony findings. No osteochondral abnormality. No joint effusion. IMPRESSION: Normal knee radiographs. Electronically  Signed   By: Rudie Meyer M.D.   On: 09/08/2018 12:19    Procedures Procedures (including critical care time)  Medications Ordered in ED Medications  ibuprofen (ADVIL,MOTRIN) tablet 400 mg (400 mg Oral Given 09/08/18 1139)     Initial Impression / Assessment and Plan / ED Course  I have reviewed the triage vital signs and the nursing notes.  Pertinent labs & imaging results that were available during my care of the patient were reviewed by me and considered in my medical decision making (see chart for details).        12:29 PM On repeat exam the patient is in no distress. I reviewed the x-ray myself, agree with interpretation. No evidence of persistent dislocation, nor fracture. We discussed possibility of recurrent dislocation given her event. Patient has had a knee immobilizer applied, crutches provided. With no evidence for fracture, some suspicion for possible soft tissue injury, but no evidence for neurovascular dysfunction, patient will full follow-up with orthopedics, use anti-inflammatories for symptom control.  Final Clinical Impressions(s) / ED Diagnoses  Knee pain, left, initial encounter   Gerhard Munch, MD 09/08/18 1230

## 2019-02-10 IMAGING — CT CT KNEE*R* W/O CM
3 of 4 series · 13 of 33 positions shown, 16 images · non-contrast
Comparison: 11/12/2016 right knee radiographs

CLINICAL DATA: Right knee dislocation 3 weeks ago. Right knee
popped out again today causing pain.

EXAM:
CT OF THE  KNEE WITHOUT CONTRAST
TECHNIQUE: Multidetector CT imaging of the knee was performed according to the
standard protocol. Multiplanar CT image reconstructions were also
generated.

[Series 2: axial bone · axial · 0.35mm/px · z∈[+445,+563]mm · 5 of 89 slices shown, 7 images]
[im 15/89  soft-tissue]
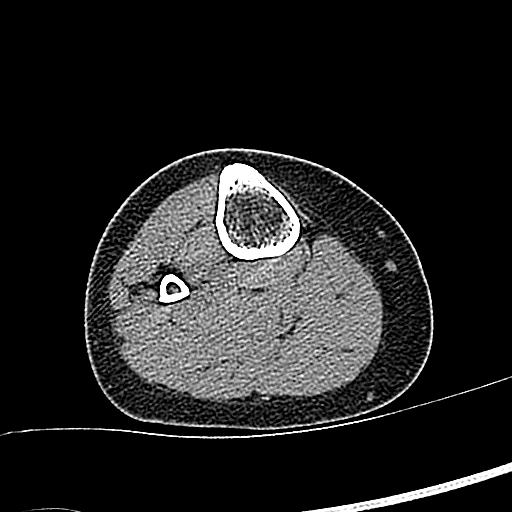
[im 15/89  bone]
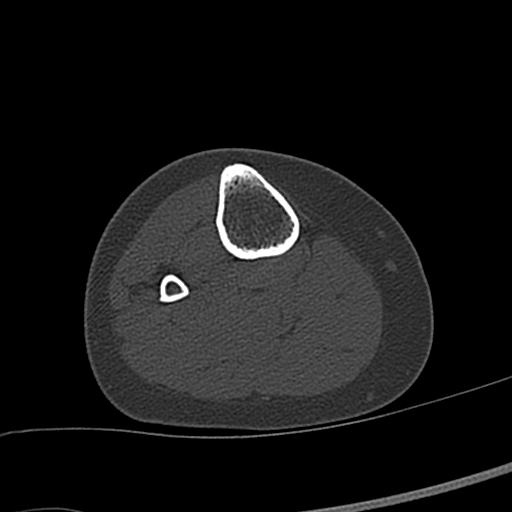
[im 30/89  bone]
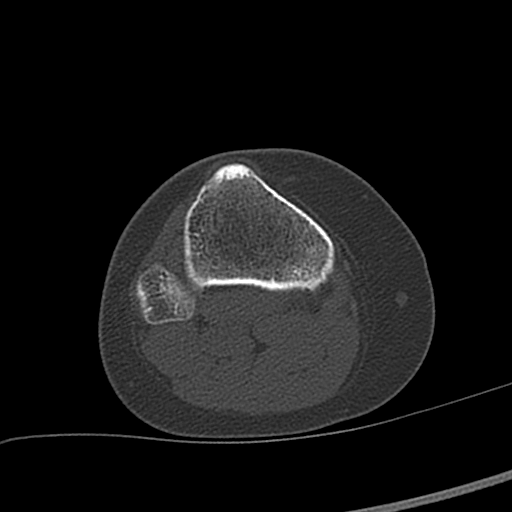
[im 45/89  bone]
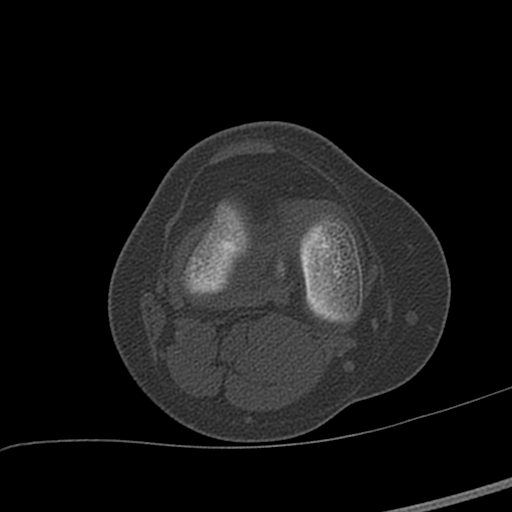
[im 59/89  bone]
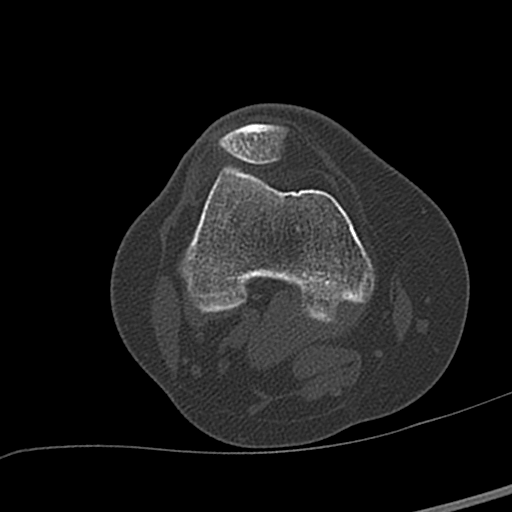
[im 74/89  soft-tissue]
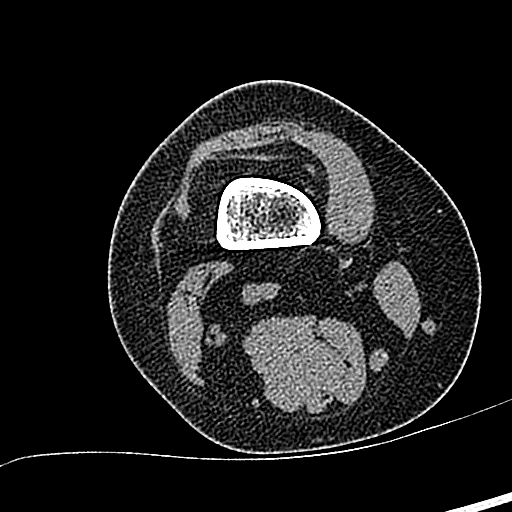
[im 74/89  bone]
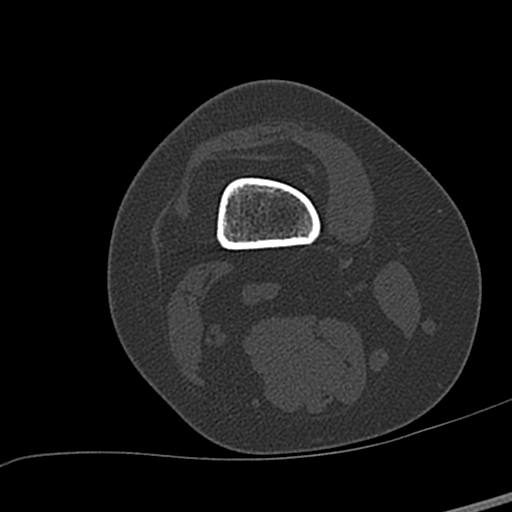

[Series 8: coronal st · coronal · 0.32mm/px · 3 of 63 slices shown]
[im 13/63  bone]
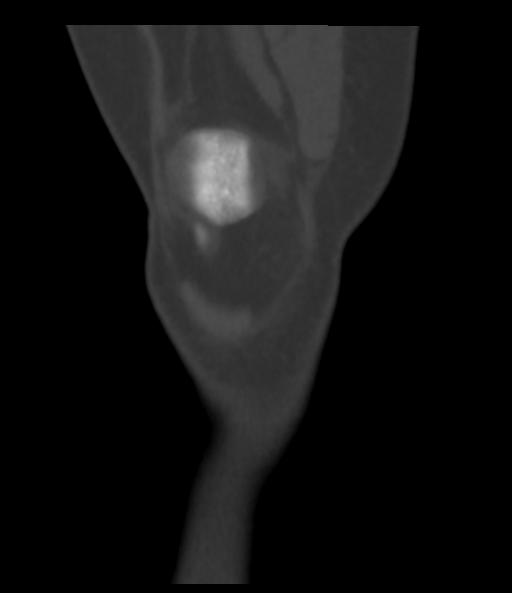
[im 25/63  bone]
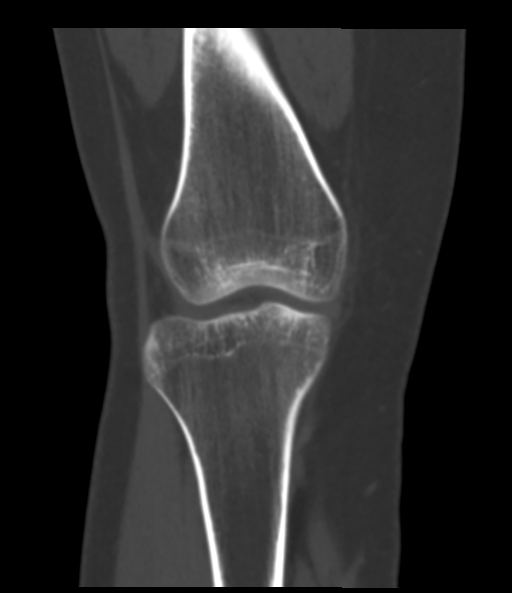
[im 38/63  bone]
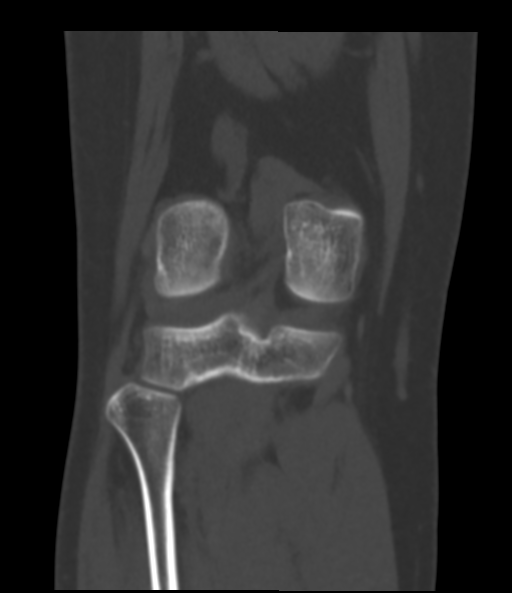

[Series 9: sagittal st · sagittal · 0.30mm/px · 5 of 61 slices shown, 6 images]
[im 21/61  bone]
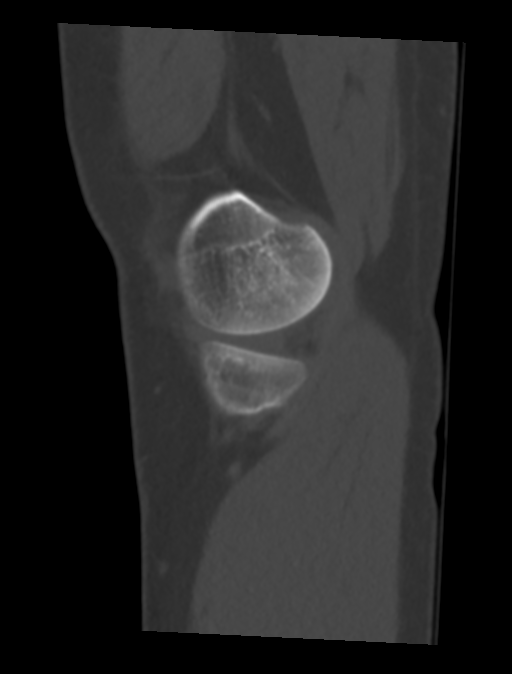
[im 26/61  bone]
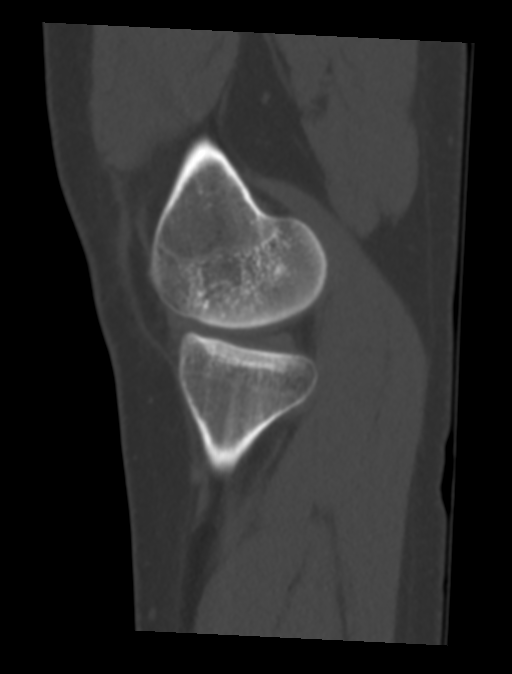
[im 31/61  soft-tissue]
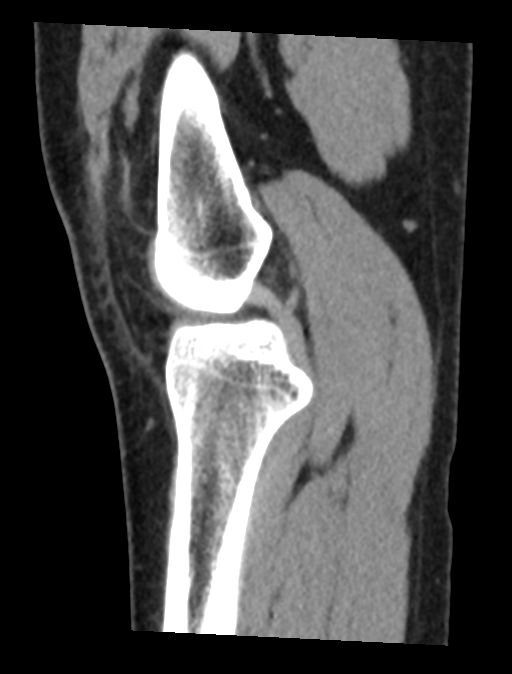
[im 31/61  bone]
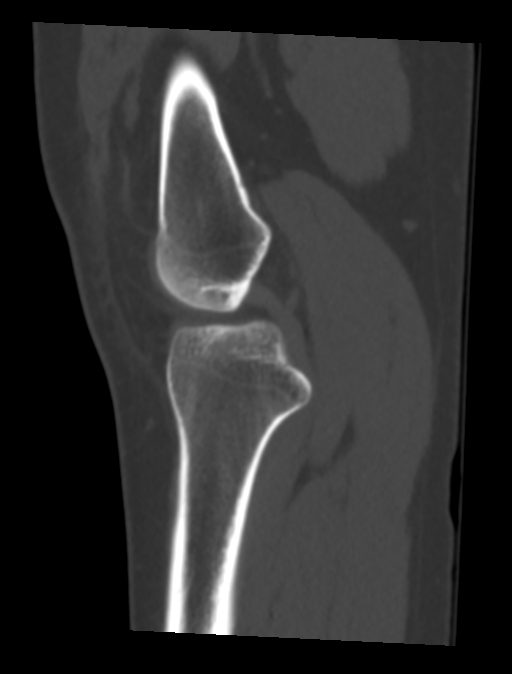
[im 36/61  bone]
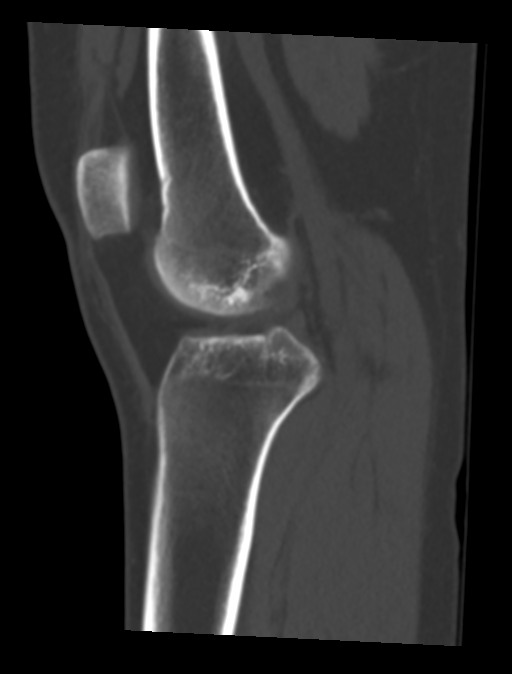
[im 41/61  bone]
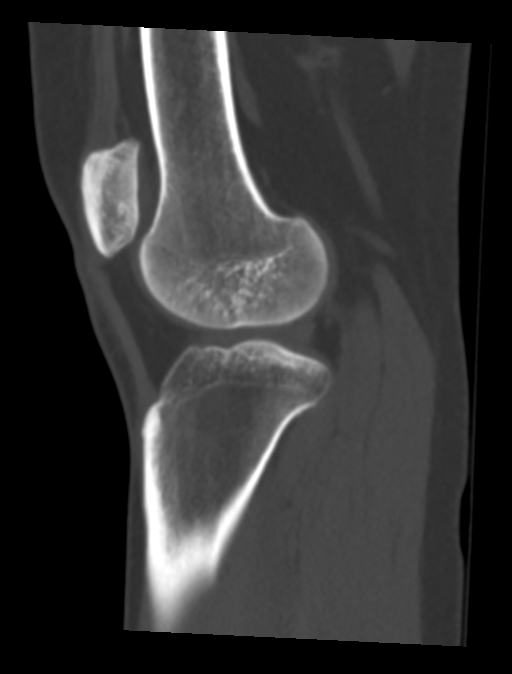

[13 of 33 positions shown; findings below may reference images not displayed]

FINDINGS: Bones/Joint/Cartilage

Slight lateral patellar subluxation on the axial views without frank
dislocation. The trochlear groove is 140 degrees which is within
normal limits however. Grossly intact medial patellofemoral
ligament. A joint effusion. There is a flattened contour for of the
medial aspect of the patella which may be secondary to prior
dislocation with impaction deformity. On the prior radiographs from
11/12/2016, this was not flattened in appearance and slightly more
convex on that study.

Ligaments

Suboptimally assessed by CT.

Muscles and Tendons

Negative

Soft tissues

Negative
IMPRESSION: Slight lateral patellar subluxation without frank dislocation.
Flattened appearance of the medial margin of patella may be
secondary to prior impaction from lateral dislocation of the
patella. No other findings of note.

## 2019-05-25 ENCOUNTER — Other Ambulatory Visit: Payer: Self-pay

## 2019-05-25 DIAGNOSIS — Z20822 Contact with and (suspected) exposure to covid-19: Secondary | ICD-10-CM

## 2019-05-26 LAB — NOVEL CORONAVIRUS, NAA: SARS-CoV-2, NAA: NOT DETECTED

## 2019-08-06 ENCOUNTER — Other Ambulatory Visit: Payer: Self-pay

## 2019-08-06 ENCOUNTER — Emergency Department (HOSPITAL_COMMUNITY)
Admission: EM | Admit: 2019-08-06 | Discharge: 2019-08-07 | Disposition: A | Discharge status: Home / Self Care | Payer: Managed Care, Other (non HMO) | Attending: Emergency Medicine | Admitting: Emergency Medicine

## 2019-08-06 DIAGNOSIS — L02414 Cutaneous abscess of left upper limb: Secondary | ICD-10-CM | POA: Insufficient documentation

## 2019-08-06 DIAGNOSIS — Z5321 Procedure and treatment not carried out due to patient leaving prior to being seen by health care provider: Secondary | ICD-10-CM | POA: Diagnosis not present

## 2019-08-06 NOTE — ED Triage Notes (Signed)
Per pt she has hx of abseces and she has one that has come up under her left arm. Pt said they come and go. This one came up on Thursday of this week. Pt said placing warm compresses but no relief.

## 2019-08-07 MED ORDER — ACETAMINOPHEN 325 MG PO TABS
650.0000 mg | ORAL_TABLET | Freq: Once | ORAL | Status: AC
  Administered 2019-08-07: 01:00:00 650 mg via ORAL
  Filled 2019-08-07: qty 2

## 2019-08-07 NOTE — ED Notes (Signed)
Pt called to recheck vitals but no answer in lobby

## 2020-05-07 ENCOUNTER — Encounter (HOSPITAL_COMMUNITY): Payer: Self-pay | Admitting: Emergency Medicine

## 2020-05-07 ENCOUNTER — Other Ambulatory Visit: Payer: Self-pay

## 2020-05-07 ENCOUNTER — Emergency Department (HOSPITAL_COMMUNITY)
Admission: EM | Admit: 2020-05-07 | Discharge: 2020-05-08 | Disposition: A | Discharge status: Home / Self Care | Payer: Managed Care, Other (non HMO) | Attending: Emergency Medicine | Admitting: Emergency Medicine

## 2020-05-07 DIAGNOSIS — R21 Rash and other nonspecific skin eruption: Secondary | ICD-10-CM | POA: Insufficient documentation

## 2020-05-07 DIAGNOSIS — Z5321 Procedure and treatment not carried out due to patient leaving prior to being seen by health care provider: Secondary | ICD-10-CM | POA: Insufficient documentation

## 2020-05-07 NOTE — ED Triage Notes (Signed)
Patient reports skin irritation at bilateral axilla this week with redness/no drainage , denies fever.

## 2020-05-08 ENCOUNTER — Encounter (HOSPITAL_COMMUNITY): Payer: Self-pay | Admitting: Emergency Medicine

## 2020-05-08 ENCOUNTER — Emergency Department
Admission: EM | Admit: 2020-05-08 | Discharge: 2020-05-08 | Disposition: A | Discharge status: Other Acute Inpatient Hospital | Payer: Medicaid Other

## 2020-05-08 ENCOUNTER — Other Ambulatory Visit: Payer: Self-pay

## 2020-05-08 ENCOUNTER — Emergency Department (HOSPITAL_COMMUNITY)
Admission: EM | Admit: 2020-05-08 | Discharge: 2020-05-09 | Disposition: A | Discharge status: Home / Self Care | Payer: Medicaid Other | Attending: Emergency Medicine | Admitting: Emergency Medicine

## 2020-05-08 DIAGNOSIS — F17210 Nicotine dependence, cigarettes, uncomplicated: Secondary | ICD-10-CM | POA: Insufficient documentation

## 2020-05-08 DIAGNOSIS — R079 Chest pain, unspecified: Secondary | ICD-10-CM | POA: Insufficient documentation

## 2020-05-08 DIAGNOSIS — Z872 Personal history of diseases of the skin and subcutaneous tissue: Secondary | ICD-10-CM

## 2020-05-08 DIAGNOSIS — R2233 Localized swelling, mass and lump, upper limb, bilateral: Secondary | ICD-10-CM | POA: Insufficient documentation

## 2020-05-08 DIAGNOSIS — L0291 Cutaneous abscess, unspecified: Secondary | ICD-10-CM

## 2020-05-08 LAB — CBC
HCT: 40.6 % (ref 36.0–46.0)
Hemoglobin: 13.1 g/dL (ref 12.0–15.0)
MCH: 29.9 pg (ref 26.0–34.0)
MCHC: 32.3 g/dL (ref 30.0–36.0)
MCV: 92.7 fL (ref 80.0–100.0)
Platelets: 348 10*3/uL (ref 150–400)
RBC: 4.38 MIL/uL (ref 3.87–5.11)
RDW: 11.7 % (ref 11.5–15.5)
WBC: 14.3 10*3/uL — ABNORMAL HIGH (ref 4.0–10.5)
nRBC: 0 % (ref 0.0–0.2)

## 2020-05-08 LAB — I-STAT BETA HCG BLOOD, ED (MC, WL, AP ONLY): I-stat hCG, quantitative: <5 m[IU]/mL (ref <5)

## 2020-05-08 LAB — BASIC METABOLIC PANEL
Anion gap: 9 (ref 5–15)
BUN: 6 mg/dL (ref 6–20)
CO2: 25 mmol/L (ref 22–32)
Calcium: 9 mg/dL (ref 8.9–10.3)
Chloride: 102 mmol/L (ref 98–111)
Creatinine, Ser: 0.78 mg/dL (ref 0.44–1.00)
GFR, Estimated: >60 mL/min (ref >60)
Glucose, Bld: 97 mg/dL (ref 70–99)
Potassium: 3.7 mmol/L (ref 3.5–5.1)
Sodium: 136 mmol/L (ref 135–145)

## 2020-05-08 LAB — TROPONIN I (HIGH SENSITIVITY): Troponin I (High Sensitivity): 3 ng/L (ref <18)

## 2020-05-08 NOTE — ED Triage Notes (Signed)
Pt reports bilateral axilla pain that is now moving to her chest.  Pt denies fever, weakness or dizziness.

## 2020-05-09 MED ORDER — CLINDAMYCIN HCL 150 MG PO CAPS
300.0000 mg | ORAL_CAPSULE | Freq: Once | ORAL | Status: AC
  Administered 2020-05-09: 300 mg via ORAL
  Filled 2020-05-09: qty 2

## 2020-05-09 MED ORDER — CLINDAMYCIN HCL 300 MG PO CAPS: 300.0000 mg | ORAL_CAPSULE | Freq: Four times a day (QID) | ORAL | 0 refills | Status: DC

## 2020-05-09 MED ORDER — HYDROCODONE-ACETAMINOPHEN 5-325 MG PO TABS
1.0000 | ORAL_TABLET | Freq: Four times a day (QID) | ORAL | 0 refills | Status: DC | PRN
Start: 2020-05-09 — End: 2020-07-21

## 2020-05-09 NOTE — Discharge Instructions (Addendum)
Begin taking clindamycin as prescribed and hydrocodone as prescribed as needed for pain.  Apply warm compresses as frequently as possible for the next several days.  Return to the emergency department for increased swelling, increased pain, high fever, or other new and concerning symptoms.

## 2020-05-09 NOTE — ED Provider Notes (Signed)
[facility] EMERGENCY DEPARTMENT Provider Note   CSN: [id number] Arrival date & time: 05/08/20  2158     History Chief Complaint  Patient presents with  . Chest Pain  . Abscess    Paula Patton is a 29 y.o. female.  Patient is a 29 year old female with past medical history of hidradenitis suppurativa presenting with complaints of pain and swelling to both axillae.  She denies any fevers or chills.  She denies any difficulty breathing.  The history is provided by the patient.  Abscess Abscess location: Axilla. Abscess quality: induration, painful and redness   Duration:  1 week Progression:  Worsening Pain details:    Severity:  Moderate   Timing:  Constant   Progression:  Worsening Chronicity:  New      History reviewed. No pertinent past medical history.  There are no problems to display for this patient.   History reviewed. No pertinent surgical history.   OB History   No obstetric history on file.     No family history on file.  Social History   Tobacco Use  . Smoking status: Current Every Day Smoker    Packs/day: 0.50    Types: Cigarettes  . Smokeless tobacco: Never Used  Substance Use Topics  . Alcohol use: No  . Drug use: No    Home Medications Prior to Admission medications   Medication Sig Start Date End Date Taking? Authorizing Provider  acetaminophen-codeine 120-12 MG/5ML solution Take 10 mLs by mouth every 4 (four) hours as needed for moderate pain (And cough). 09/05/17   Lawyer, Cristal Deer, PA-C  Guaifenesin 1200 MG TB12 Take 1 tablet (1,200 mg total) by mouth 2 (two) times daily. 09/05/17   Lawyer, Cristal Deer, PA-C  ibuprofen (ADVIL,MOTRIN) 800 MG tablet Take 1 tablet (800 mg total) by mouth every 8 (eight) hours as needed. 09/05/17   Lawyer, Cristal Deer, PA-C  naproxen (NAPROSYN) 500 MG tablet Take 1 tablet (500 mg total) by mouth 2 (two) times daily with a meal. 08/21/17   Petrucelli, Samantha R, PA-C  phenazopyridine  (PYRIDIUM) 200 MG tablet Take 1 tablet (200 mg total) by mouth 3 (three) times daily. 01/22/17   Mardella Layman, MD    Allergies    Patient has no known allergies.  Review of Systems   Review of Systems  All other systems reviewed and are negative.   Physical Exam Updated Vital Signs BP 118/72 (BP Location: Left Arm)   Pulse 96   Temp 98.2 F (36.8 C) (Oral)   Resp 18   Ht 5' (1.524 m)   Wt 56.7 kg   SpO2 94%   BMI 24.41 kg/m   Physical Exam Vitals and nursing note reviewed.  Constitutional:      General: She is not in acute distress.    Appearance: She is well-developed. She is not ill-appearing, toxic-appearing or diaphoretic.  HENT:     Head: Normocephalic.  Pulmonary:     Effort: Pulmonary effort is normal.  Musculoskeletal:     Cervical back: Normal range of motion.  Skin:    Comments: Bilateral axillae with scar tissue.  There is some induration and tenderness, especially to the right.  There is some purulent drainage.  I am unable to appreciate fluctuance.  Neurological:     Mental Status: She is alert.     ED Results / Procedures / Treatments   Labs (all labs ordered are listed, but only abnormal results are displayed) Labs Reviewed  CBC - Abnormal; Notable for  the following components:      Result Value   WBC 14.3 (*)    All other components within normal limits  BASIC METABOLIC PANEL  I-STAT BETA HCG BLOOD, ED ([facility], [facility], [facility] ONLY)  TROPONIN I (HIGH SENSITIVITY)  TROPONIN I (HIGH SENSITIVITY)    EKG EKG Interpretation  Date/Time:  Tuesday May 08 2020 22:11:19 EDT Ventricular Rate:  91 PR Interval:  164 QRS Duration: 80 QT Interval:  328 QTC Calculation: 403 R Axis:   84 Text Interpretation: Normal sinus rhythm Normal ECG Confirmed by Geoffery Lyons ([id number]) on 05/09/2020 1:54:53 AM   Radiology No results found.  Procedures Procedures (including critical care time)  Medications Ordered in ED Medications  clindamycin (CLEOCIN) capsule  300 mg (has no administration in time range)    ED Course  I have reviewed the triage vital signs and the nursing notes.  Pertinent labs & imaging results that were available during my care of the patient were reviewed by me and considered in my medical decision making (see chart for details).    MDM Rules/Calculators/A&P  Patient with worsening pain and swelling to the right axilla and left axilla.  She has history of hidradenitis suppurativa.  She is more tender and having more pain on the right.  There appears to be an area that is draining purulent material.  Unable to appreciate any fluctuance that would require incision and drainage.  Patient will be treated with antibiotics, warm compresses, and follow-up as needed.  Final Clinical Impression(s) / ED Diagnoses Final diagnoses:  None    Rx / DC Orders ED Discharge Orders    None       Geoffery Lyons, MD 05/09/20 [phone]

## 2020-07-21 ENCOUNTER — Emergency Department: Payer: Medicaid Other

## 2020-07-21 ENCOUNTER — Encounter: Payer: Self-pay | Admitting: Emergency Medicine

## 2020-07-21 ENCOUNTER — Other Ambulatory Visit: Payer: Self-pay

## 2020-07-21 ENCOUNTER — Emergency Department
Admission: EM | Admit: 2020-07-21 | Discharge: 2020-07-21 | Disposition: A | Discharge status: Home / Self Care | Payer: Medicaid Other | Attending: Emergency Medicine | Admitting: Emergency Medicine

## 2020-07-21 DIAGNOSIS — S82892A Other fracture of left lower leg, initial encounter for closed fracture: Secondary | ICD-10-CM

## 2020-07-21 DIAGNOSIS — W010XXA Fall on same level from slipping, tripping and stumbling without subsequent striking against object, initial encounter: Secondary | ICD-10-CM | POA: Insufficient documentation

## 2020-07-21 DIAGNOSIS — F17210 Nicotine dependence, cigarettes, uncomplicated: Secondary | ICD-10-CM | POA: Insufficient documentation

## 2020-07-21 DIAGNOSIS — Z79899 Other long term (current) drug therapy: Secondary | ICD-10-CM | POA: Insufficient documentation

## 2020-07-21 DIAGNOSIS — T1490XA Injury, unspecified, initial encounter: Secondary | ICD-10-CM

## 2020-07-21 DIAGNOSIS — S92152A Displaced avulsion fracture (chip fracture) of left talus, initial encounter for closed fracture: Secondary | ICD-10-CM | POA: Insufficient documentation

## 2020-07-21 MED ORDER — NAPROXEN 500 MG PO TABS
500.0000 mg | ORAL_TABLET | Freq: Once | ORAL | Status: AC
  Administered 2020-07-21: 500 mg via ORAL
  Filled 2020-07-21: qty 1

## 2020-07-21 MED ORDER — NAPROXEN 500 MG PO TABS: 500.0000 mg | ORAL_TABLET | Freq: Two times a day (BID) | ORAL | 0 refills | Status: DC

## 2020-07-21 MED ORDER — HYDROCODONE-ACETAMINOPHEN 5-325 MG PO TABS
1.0000 | ORAL_TABLET | Freq: Four times a day (QID) | ORAL | 0 refills | Status: AC | PRN
Start: 2020-07-21 — End: 2020-07-24

## 2020-07-21 NOTE — ED Triage Notes (Signed)
Pt comes pov with left ankle pain and swelling after falling down a couple of stairs.

## 2020-07-21 NOTE — ED Provider Notes (Signed)
[facility] EMERGENCY DEPARTMENT Provider Note   CSN: [id number] Arrival date & time: 07/21/20  1005     History Chief Complaint  Patient presents with  . Ankle Pain    Paula Patton is a 30 y.o. female.  30 year old female presenting to the Emergency Department for treatment and evaluation of ankle pain after falling down a couple of stairs.  She states that she was wearing high-heeled shoes and just did not step correctly which caused her left foot to turn inward and then she tripped on the step.  Incident occurred just prior to arrival.  Pain increases with attempts at weightbearing.  No alleviating measures attempted prior to arrival.        History reviewed. No pertinent past medical history.  There are no problems to display for this patient.   History reviewed. No pertinent surgical history.   OB History   No obstetric history on file.     History reviewed. No pertinent family history.  Social History   Tobacco Use  . Smoking status: Current Every Day Smoker    Packs/day: 0.50    Types: Cigarettes  . Smokeless tobacco: Never Used  Substance Use Topics  . Alcohol use: No  . Drug use: No    Home Medications Prior to Admission medications   Medication Sig Start Date End Date Taking? Authorizing Provider  HYDROcodone-acetaminophen (NORCO/VICODIN) 5-325 MG tablet Take 1 tablet by mouth every 6 (six) hours as needed for up to 3 days for severe pain. 07/21/20 07/24/20 Yes Pansey Pinheiro B, FNP  naproxen (NAPROSYN) 500 MG tablet Take 1 tablet (500 mg total) by mouth 2 (two) times daily with a meal. 07/21/20  Yes Maryana Pittmon B, FNP  acetaminophen-codeine 120-12 MG/5ML solution Take 10 mLs by mouth every 4 (four) hours as needed for moderate pain (And cough). 09/05/17   Lawyer, Cristal Deer, PA-C  clindamycin (CLEOCIN) 300 MG capsule Take 1 capsule (300 mg total) by mouth 4 (four) times daily. X 7 days 05/09/20   Geoffery Lyons, MD  Guaifenesin  1200 MG TB12 Take 1 tablet (1,200 mg total) by mouth 2 (two) times daily. 09/05/17   Lawyer, Cristal Deer, PA-C  ibuprofen (ADVIL,MOTRIN) 800 MG tablet Take 1 tablet (800 mg total) by mouth every 8 (eight) hours as needed. 09/05/17   Lawyer, Cristal Deer, PA-C  phenazopyridine (PYRIDIUM) 200 MG tablet Take 1 tablet (200 mg total) by mouth 3 (three) times daily. 01/22/17   Mardella Layman, MD    Allergies    Patient has no known allergies.  Review of Systems   Review of Systems  HENT: Negative.   Respiratory: Negative.   Cardiovascular: Negative.   Musculoskeletal: Positive for gait problem and joint swelling. Negative for neck pain and neck stiffness.  Skin: Negative for color change and wound.    Physical Exam Updated Vital Signs BP 110/64 (BP Location: Left Arm)   Pulse 65   Temp 98.9 F (37.2 C) (Oral)   Resp 18   Ht 5' (1.524 m)   Wt 56.7 kg   LMP 07/10/2020   SpO2 100%   BMI 24.41 kg/m   Physical Exam Constitutional:      Appearance: Normal appearance.  HENT:     Head: Atraumatic.     Nose: Nose normal.     Mouth/Throat:     Mouth: Mucous membranes are moist.  Cardiovascular:     Rate and Rhythm: Normal rate and regular rhythm.     Pulses:  Dorsalis pedis pulses are 2+ on the right side and 2+ on the left side.       Posterior tibial pulses are 2+ on the right side and 2+ on the left side.  Pulmonary:     Effort: Pulmonary effort is normal.  Musculoskeletal:     Cervical back: Normal range of motion.     Left foot: Decreased range of motion.  Feet:     Right foot:     Skin integrity: Skin integrity normal.     Left foot:     Skin integrity: Skin integrity normal.  Skin:    General: Skin is warm and dry.     Capillary Refill: Capillary refill takes less than 2 seconds.  Neurological:     Mental Status: She is alert.     GCS: GCS eye subscore is 4. GCS verbal subscore is 5. GCS motor subscore is 6.     ED Results / Procedures / Treatments    Labs (all labs ordered are listed, but only abnormal results are displayed) Labs Reviewed - No data to display  EKG None  Radiology DG Ankle Left Port  Result Date: 07/21/2020 CLINICAL DATA:  30 year old female with left ankle pain after twisting her ankle while walking down the stairs EXAM: PORTABLE LEFT ANKLE - 2 VIEW COMPARISON:  None. FINDINGS: Small osseous fragments dorsal to the talar neck with associated soft tissue swelling. Additionally, there may be a small osseous fragment dorsal to the navicular. Findings suggest avulsion fractures from the anterior insertion of the joint capsule. The ankle mortise remains congruent and the talar dome is intact. The medial and lateral malleoli appear to be intact. IMPRESSION: Small avulsion fractures from the dorsal talar neck and dorsal aspect of the tarsal navicular in the region of the dorsal insertion of the ankle joint capsule. Electronically Signed   By: Malachy Moan M.D.   On: 07/21/2020 11:25    Procedures Procedures (including critical care time)  Medications Ordered in ED Medications  naproxen (NAPROSYN) tablet 500 mg (500 mg Oral Given 07/21/20 1301)    ED Course  I have reviewed the triage vital signs and the nursing notes.  Pertinent labs & imaging results that were available during my care of the patient were reviewed by me and considered in my medical decision making (see chart for details).    MDM Rules/Calculators/A&P                          29 year old female presenting to the emergency department after mechanical injury to her left ankle just prior to arrival.  See HPI for further details.  Image of the ankle does show small avulsion fractures from the talar neck and tarsal navicular bones.  She will be placed in a cam walker and encouraged to call and schedule follow-up appointment with podiatry.  She will given be given prescriptions for Norco and Naprosyn.  She will be encouraged to rest, ice, and elevate the  foot and ankle often throughout the day.  She was encouraged to return to the emergency department for symptoms of change or worsen if she is unable to schedule an appointment.  Final Clinical Impression(s) / ED Diagnoses Final diagnoses:  Injury  Closed avulsion fracture of left ankle, initial encounter    Rx / DC Orders ED Discharge Orders         Ordered    HYDROcodone-acetaminophen (NORCO/VICODIN) 5-325 MG tablet  Every 6 hours PRN  07/21/20 1245    naproxen (NAPROSYN) 500 MG tablet  2 times daily with meals        07/21/20 1245           Victorino Dike, FNP 07/21/20 2025    Naaman Plummer, MD 07/22/20 [phone]

## 2020-07-21 NOTE — ED Notes (Signed)
Pt reports left ankle pain after falling down the last down steps. Pt reports she hit the ground on her left foot first and rolled. Pt wearing heels at the time.   Pt presents with pain and swelling to left ankle. Pt does report some pain with slight lateral swelling to right ankle, no loss of mobility.   Pt unable to bear weight, loss of ROM

## 2020-07-21 NOTE — Discharge Instructions (Signed)
Rest, ice, and elevate your leg as often as possible for the next several days.  Wear the boot any time you are up moving around.  Take the pain medication as prescribed. Do not take any additional tylenol.  Follow up with podiatry. Call Monday for an appointment.   Return to the ER for symptoms that change or worsen if unable to schedule an appointment.

## 2020-07-21 NOTE — ED Notes (Signed)
Pt reports she has difficulty wiggling her left toes, shooting pain with attempt to move them but does not report pain to toes specifically

## 2021-08-30 ENCOUNTER — Emergency Department
Admission: EM | Admit: 2021-08-30 | Discharge: 2021-08-30 | Disposition: A | Discharge status: Home / Self Care | Payer: Self-pay | Attending: Emergency Medicine | Admitting: Emergency Medicine

## 2021-08-30 ENCOUNTER — Other Ambulatory Visit: Payer: Self-pay

## 2021-08-30 ENCOUNTER — Emergency Department: Payer: Self-pay

## 2021-08-30 DIAGNOSIS — M25571 Pain in right ankle and joints of right foot: Secondary | ICD-10-CM | POA: Insufficient documentation

## 2021-08-30 MED ORDER — IBUPROFEN 800 MG PO TABS: 800.0000 mg | ORAL_TABLET | Freq: Three times a day (TID) | ORAL | 0 refills | Status: DC | PRN

## 2021-08-30 MED ORDER — IBUPROFEN 800 MG PO TABS
800.0000 mg | ORAL_TABLET | Freq: Once | ORAL | Status: AC
  Administered 2021-08-30: 800 mg via ORAL
  Filled 2021-08-30: qty 1
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
## 2021-08-30 NOTE — Discharge Instructions (Addendum)
The Motrin 800 mg 3 times a day with food.  Follow-up with orthopedic surgery, Dr. Sharlet Salina.  Give his office a call in the morning let him know you have an ankle sprain.  They should go to see you in about a week.  Use the crutches.  Bear weight as tolerated with your foot.  Wear the Aircast if it helps.  Return for increasing pain or numbness or if you are foot gets blue or cold.  None of those things should happen but I am telling you just in case.

## 2022-05-08 ENCOUNTER — Encounter (HOSPITAL_COMMUNITY): Payer: Self-pay | Admitting: Emergency Medicine

## 2022-05-08 ENCOUNTER — Emergency Department (HOSPITAL_COMMUNITY): Payer: Medicaid Other

## 2022-05-08 ENCOUNTER — Emergency Department (HOSPITAL_COMMUNITY)
Admission: EM | Admit: 2022-05-08 | Discharge: 2022-05-09 | Disposition: A | Discharge status: Home / Self Care | Payer: Self-pay | Attending: Emergency Medicine | Admitting: Emergency Medicine

## 2022-05-08 DIAGNOSIS — M542 Cervicalgia: Secondary | ICD-10-CM | POA: Diagnosis not present

## 2022-05-08 DIAGNOSIS — Y92410 Unspecified street and highway as the place of occurrence of the external cause: Secondary | ICD-10-CM | POA: Diagnosis not present

## 2022-05-08 DIAGNOSIS — M546 Pain in thoracic spine: Secondary | ICD-10-CM | POA: Diagnosis present

## 2022-05-08 DIAGNOSIS — M545 Low back pain, unspecified: Secondary | ICD-10-CM | POA: Insufficient documentation

## 2022-05-08 DIAGNOSIS — E876 Hypokalemia: Secondary | ICD-10-CM | POA: Insufficient documentation

## 2022-05-08 DIAGNOSIS — R1030 Lower abdominal pain, unspecified: Secondary | ICD-10-CM | POA: Diagnosis not present

## 2022-05-08 LAB — I-STAT CHEM 8, ED
BUN: 4 mg/dL — ABNORMAL LOW (ref 6–20)
Calcium, Ion: 1.21 mmol/L (ref 1.15–1.40)
Chloride: 100 mmol/L (ref 98–111)
Creatinine, Ser: 0.9 mg/dL (ref 0.44–1.00)
Glucose, Bld: 128 mg/dL — ABNORMAL HIGH (ref 70–99)
HCT: 35 % — ABNORMAL LOW (ref 36.0–46.0)
Hemoglobin: 11.9 g/dL — ABNORMAL LOW (ref 12.0–15.0)
Potassium: 3.4 mmol/L — ABNORMAL LOW (ref 3.5–5.1)
Sodium: 140 mmol/L (ref 135–145)
TCO2: 25 mmol/L (ref 22–32)

## 2022-05-08 LAB — I-STAT BETA HCG BLOOD, ED (MC, WL, AP ONLY): I-stat hCG, quantitative: <5 m[IU]/mL (ref <5)

## 2022-05-08 MED ORDER — CYCLOBENZAPRINE HCL 10 MG PO TABS
10.0000 mg | ORAL_TABLET | Freq: Once | ORAL | Status: AC
  Administered 2022-05-09: 10 mg via ORAL
  Filled 2022-05-08: qty 1

## 2022-05-08 MED ORDER — IOHEXOL 350 MG/ML SOLN
75.0000 mL | Freq: Once | INTRAVENOUS | Status: AC | PRN
  Administered 2022-05-08: 75 mL via INTRAVENOUS

## 2022-05-08 MED ORDER — NAPROXEN 250 MG PO TABS
500.0000 mg | ORAL_TABLET | Freq: Once | ORAL | Status: AC
  Administered 2022-05-09: 500 mg via ORAL
  Filled 2022-05-08: qty 2
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
## 2022-05-09 MED ORDER — NAPROXEN 500 MG PO TABS: 500.0000 mg | ORAL_TABLET | Freq: Two times a day (BID) | ORAL | 0 refills | Status: AC

## 2022-05-09 MED ORDER — CYCLOBENZAPRINE HCL 10 MG PO TABS: 10.0000 mg | ORAL_TABLET | Freq: Two times a day (BID) | ORAL | 0 refills | Status: AC | PRN

## 2022-05-09 NOTE — Discharge Instructions (Addendum)
The CT of your lumbar spine showed no acute findings.   You are prescribed medication and to help with your pain.  The first medication is Flexeril, this is a muscle relaxer that should help with any muscle spasms.  Please take 1 tablet twice a day for the next 7 days.  Please be aware this medication can make you drowsy, do not drink alcohol while taking this medication.  The second medication is a anti-inflammatory called naproxen, you will need to take 1 tablet twice a day with food to help with your pain.

## 2022-05-09 NOTE — ED Notes (Signed)
Patient verbalizes understanding of discharge instructions. Opportunity for questioning and answers were provided. Armband removed by staff, pt discharged from ED. Pt ambulatory to ED waiting room with steady gait.  

## 2022-06-20 DIAGNOSIS — Z419 Encounter for procedure for purposes other than remedying health state, unspecified: Secondary | ICD-10-CM | POA: Diagnosis not present

## 2022-07-21 DIAGNOSIS — Z419 Encounter for procedure for purposes other than remedying health state, unspecified: Secondary | ICD-10-CM | POA: Diagnosis not present

## 2022-08-21 DIAGNOSIS — Z419 Encounter for procedure for purposes other than remedying health state, unspecified: Secondary | ICD-10-CM | POA: Diagnosis not present

## 2022-09-19 DIAGNOSIS — Z419 Encounter for procedure for purposes other than remedying health state, unspecified: Secondary | ICD-10-CM | POA: Diagnosis not present

## 2022-10-01 ENCOUNTER — Telehealth: Payer: Self-pay

## 2022-10-01 NOTE — Telephone Encounter (Signed)
Mychart msg sent

## 2022-10-20 DIAGNOSIS — Z419 Encounter for procedure for purposes other than remedying health state, unspecified: Secondary | ICD-10-CM | POA: Diagnosis not present

## 2022-11-19 DIAGNOSIS — Z419 Encounter for procedure for purposes other than remedying health state, unspecified: Secondary | ICD-10-CM | POA: Diagnosis not present

## 2022-12-20 DIAGNOSIS — Z419 Encounter for procedure for purposes other than remedying health state, unspecified: Secondary | ICD-10-CM | POA: Diagnosis not present

## 2023-01-19 DIAGNOSIS — Z419 Encounter for procedure for purposes other than remedying health state, unspecified: Secondary | ICD-10-CM | POA: Diagnosis not present

## 2023-02-19 DIAGNOSIS — Z419 Encounter for procedure for purposes other than remedying health state, unspecified: Secondary | ICD-10-CM | POA: Diagnosis not present

## 2023-03-22 DIAGNOSIS — Z419 Encounter for procedure for purposes other than remedying health state, unspecified: Secondary | ICD-10-CM | POA: Diagnosis not present

## 2023-04-21 DIAGNOSIS — Z419 Encounter for procedure for purposes other than remedying health state, unspecified: Secondary | ICD-10-CM | POA: Diagnosis not present

## 2023-05-18 ENCOUNTER — Emergency Department (HOSPITAL_COMMUNITY)
Admission: EM | Admit: 2023-05-18 | Discharge: 2023-05-19 | Discharge status: Against Medical Advice | Payer: BC Managed Care – PPO | Attending: Emergency Medicine | Admitting: Emergency Medicine

## 2023-05-18 DIAGNOSIS — Z3A00 Weeks of gestation of pregnancy not specified: Secondary | ICD-10-CM | POA: Insufficient documentation

## 2023-05-18 DIAGNOSIS — O219 Vomiting of pregnancy, unspecified: Secondary | ICD-10-CM | POA: Insufficient documentation

## 2023-05-18 DIAGNOSIS — Z5321 Procedure and treatment not carried out due to patient leaving prior to being seen by health care provider: Secondary | ICD-10-CM | POA: Diagnosis not present

## 2023-05-19 ENCOUNTER — Other Ambulatory Visit: Payer: Self-pay

## 2023-05-19 ENCOUNTER — Encounter (HOSPITAL_COMMUNITY): Payer: Self-pay | Admitting: Emergency Medicine

## 2023-05-19 DIAGNOSIS — O219 Vomiting of pregnancy, unspecified: Secondary | ICD-10-CM | POA: Diagnosis not present

## 2023-05-19 LAB — CBC WITH DIFFERENTIAL/PLATELET
Abs Immature Granulocytes: 0.09 10*3/uL — ABNORMAL HIGH (ref 0.00–0.07)
Basophils Absolute: 0.1 10*3/uL (ref 0.0–0.1)
Basophils Relative: 0 %
Eosinophils Absolute: 0.2 10*3/uL (ref 0.0–0.5)
Eosinophils Relative: 1 %
HCT: 33.8 % — ABNORMAL LOW (ref 36.0–46.0)
Hemoglobin: 11.4 g/dL — ABNORMAL LOW (ref 12.0–15.0)
Immature Granulocytes: 1 %
Lymphocytes Relative: 15 %
Lymphs Abs: 2.5 10*3/uL (ref 0.7–4.0)
MCH: 28.9 pg (ref 26.0–34.0)
MCHC: 33.7 g/dL (ref 30.0–36.0)
MCV: 85.6 fL (ref 80.0–100.0)
Monocytes Absolute: 0.6 10*3/uL (ref 0.1–1.0)
Monocytes Relative: 4 %
Neutro Abs: 13.7 10*3/uL — ABNORMAL HIGH (ref 1.7–7.7)
Neutrophils Relative %: 79 %
Platelets: 358 10*3/uL (ref 150–400)
RBC: 3.95 MIL/uL (ref 3.87–5.11)
RDW: 12.6 % (ref 11.5–15.5)
WBC: 17 10*3/uL — ABNORMAL HIGH (ref 4.0–10.5)
nRBC: 0 % (ref 0.0–0.2)

## 2023-05-19 LAB — COMPREHENSIVE METABOLIC PANEL
ALT: 24 U/L (ref 0–44)
AST: 22 U/L (ref 15–41)
Albumin: 3.1 g/dL — ABNORMAL LOW (ref 3.5–5.0)
Alkaline Phosphatase: 46 U/L (ref 38–126)
Anion gap: 12 (ref 5–15)
BUN: 5 mg/dL — ABNORMAL LOW (ref 6–20)
CO2: 21 mmol/L — ABNORMAL LOW (ref 22–32)
Calcium: 9.2 mg/dL (ref 8.9–10.3)
Chloride: 100 mmol/L (ref 98–111)
Creatinine, Ser: 0.76 mg/dL (ref 0.44–1.00)
GFR, Estimated: >60 mL/min (ref >60)
Glucose, Bld: 175 mg/dL — ABNORMAL HIGH (ref 70–99)
Potassium: 3.5 mmol/L (ref 3.5–5.1)
Sodium: 133 mmol/L — ABNORMAL LOW (ref 135–145)
Total Bilirubin: 0.4 mg/dL (ref 0.3–1.2)
Total Protein: 6.9 g/dL (ref 6.5–8.1)

## 2023-05-19 LAB — HCG, QUANTITATIVE, PREGNANCY: hCG, Beta Chain, Quant, S: 194288 m[IU]/mL — ABNORMAL HIGH (ref <5)

## 2023-05-19 NOTE — ED Notes (Signed)
Pt decided to leave while waiting for a room.  

## 2023-05-19 NOTE — ED Triage Notes (Signed)
Pt in with emesis and weakness, states she is recently pregnant per positive HPT (LMP 9/15). States she has been constantly n/v and unable to keep anything down in the past day. Denies any abdominal pain or vaginal bleeding.

## 2023-05-22 DIAGNOSIS — Z419 Encounter for procedure for purposes other than remedying health state, unspecified: Secondary | ICD-10-CM | POA: Diagnosis not present

## 2023-06-10 LAB — OB RESULTS CONSOLE HEPATITIS B SURFACE ANTIGEN: Hepatitis B Surface Ag: NEGATIVE

## 2023-06-10 LAB — OB RESULTS CONSOLE RUBELLA ANTIBODY, IGM: Rubella: IMMUNE

## 2023-06-10 LAB — OB RESULTS CONSOLE GC/CHLAMYDIA
Chlamydia: NEGATIVE
Neisseria Gonorrhea: NEGATIVE

## 2023-06-10 LAB — OB RESULTS CONSOLE ANTIBODY SCREEN: Antibody Screen: NEGATIVE

## 2023-06-10 LAB — HEPATITIS C ANTIBODY: HCV Ab: NEGATIVE

## 2023-06-10 LAB — OB RESULTS CONSOLE RPR: RPR: NONREACTIVE

## 2023-06-10 LAB — OB RESULTS CONSOLE HIV ANTIBODY (ROUTINE TESTING): HIV: NONREACTIVE

## 2023-06-15 ENCOUNTER — Telehealth: Payer: Self-pay

## 2023-06-15 ENCOUNTER — Other Ambulatory Visit: Payer: Self-pay | Admitting: Student

## 2023-06-15 DIAGNOSIS — O283 Abnormal ultrasonic finding on antenatal screening of mother: Secondary | ICD-10-CM

## 2023-06-17 ENCOUNTER — Encounter: Payer: Self-pay | Admitting: *Deleted

## 2023-06-17 DIAGNOSIS — O283 Abnormal ultrasonic finding on antenatal screening of mother: Secondary | ICD-10-CM | POA: Insufficient documentation

## 2023-06-17 DIAGNOSIS — O43103 Malformation of placenta, unspecified, third trimester: Secondary | ICD-10-CM | POA: Insufficient documentation

## 2023-06-17 DIAGNOSIS — O34219 Maternal care for unspecified type scar from previous cesarean delivery: Secondary | ICD-10-CM | POA: Insufficient documentation

## 2023-06-21 DIAGNOSIS — Z419 Encounter for procedure for purposes other than remedying health state, unspecified: Secondary | ICD-10-CM | POA: Diagnosis not present

## 2023-06-29 ENCOUNTER — Ambulatory Visit: Payer: BC Managed Care – PPO | Admitting: *Deleted

## 2023-06-29 ENCOUNTER — Ambulatory Visit: Payer: BC Managed Care – PPO | Attending: Student

## 2023-06-29 ENCOUNTER — Ambulatory Visit (HOSPITAL_BASED_OUTPATIENT_CLINIC_OR_DEPARTMENT_OTHER): Payer: BC Managed Care – PPO | Admitting: Obstetrics

## 2023-06-29 ENCOUNTER — Encounter: Payer: Self-pay | Admitting: *Deleted

## 2023-06-29 ENCOUNTER — Other Ambulatory Visit: Payer: Self-pay

## 2023-06-29 DIAGNOSIS — O99331 Smoking (tobacco) complicating pregnancy, first trimester: Secondary | ICD-10-CM | POA: Diagnosis not present

## 2023-06-29 DIAGNOSIS — O34219 Maternal care for unspecified type scar from previous cesarean delivery: Secondary | ICD-10-CM

## 2023-06-29 DIAGNOSIS — O283 Abnormal ultrasonic finding on antenatal screening of mother: Secondary | ICD-10-CM | POA: Insufficient documentation

## 2023-06-29 DIAGNOSIS — O43101 Malformation of placenta, unspecified, first trimester: Secondary | ICD-10-CM

## 2023-06-29 DIAGNOSIS — F17210 Nicotine dependence, cigarettes, uncomplicated: Secondary | ICD-10-CM

## 2023-06-29 DIAGNOSIS — O359XX0 Maternal care for (suspected) fetal abnormality and damage, unspecified, not applicable or unspecified: Secondary | ICD-10-CM | POA: Insufficient documentation

## 2023-06-29 DIAGNOSIS — Z3A12 12 weeks gestation of pregnancy: Secondary | ICD-10-CM | POA: Diagnosis not present

## 2023-06-29 NOTE — Progress Notes (Signed)
MFM Note  Paula Patton is currently at 12 weeks and 1 day.  She was seen for a first trimester ultrasound as a cyst was noted on the fetal surface of the placenta during a recent ultrasound performed in your office.  The patient has a history of 3 prior cesarean deliveries.    The patient has not had a screening test for fetal aneuploidy drawn because she is concerned regarding the cost of the test.  Other she denies any problems in her current pregnancy and denies any significant past medical history.  The crown-rump length measured today is consistent with her gestational age, giving her an [facility] of January 10, 2024.  The nuchal translucency measured less than 1 mm, which is within normal limits.  A 3 cm complex appearing cystic structure was noted on the fetal surface of the posterior placenta.  There was no blood flow noted in the cystic structure on color-flow Doppler studies.  The fetus is located on the other side of the cystic structure.    The patient was reassured that the cystic structure will probably not affect her fetus.    She was advised that if the cystic structure remains similar in size and the fetus and the placenta continue to grow, the cystic structure may not be visualized during her future ultrasound exams.  There were no signs of placenta accreta noted today.    Due to the cystic structure on the fetal surface of the placenta, a detailed fetal anatomy scan was scheduled in our office in 7 weeks.  The patient was given a form to contact [employer] to calculate the cost of the Panorama cell free DNA test.  She will make a decision regarding whether or not she wants the cell free DNA test drawn once she knows what the cost of the test will be.  She may have the Panorama test drawn in your office or ours.   The patient stated that all of her questions were answered today.  A total of 30 minutes was spent counseling and coordinating the care for this patient.  Greater than 50%  of the time was spent in direct face-to-face contact.

## 2023-06-30 ENCOUNTER — Other Ambulatory Visit: Payer: Self-pay | Admitting: *Deleted

## 2023-06-30 DIAGNOSIS — O4391 Unspecified placental disorder, first trimester: Secondary | ICD-10-CM

## 2023-06-30 DIAGNOSIS — O3691X0 Maternal care for fetal problem, unspecified, first trimester, not applicable or unspecified: Secondary | ICD-10-CM | POA: Insufficient documentation

## 2023-07-22 DIAGNOSIS — Z419 Encounter for procedure for purposes other than remedying health state, unspecified: Secondary | ICD-10-CM | POA: Diagnosis not present

## 2023-08-17 ENCOUNTER — Other Ambulatory Visit: Payer: Self-pay | Admitting: *Deleted

## 2023-08-17 ENCOUNTER — Ambulatory Visit: Payer: BC Managed Care – PPO

## 2023-08-17 ENCOUNTER — Other Ambulatory Visit: Payer: Self-pay

## 2023-08-17 ENCOUNTER — Ambulatory Visit: Payer: BC Managed Care – PPO | Attending: Obstetrics

## 2023-08-17 ENCOUNTER — Ambulatory Visit (HOSPITAL_BASED_OUTPATIENT_CLINIC_OR_DEPARTMENT_OTHER): Payer: BC Managed Care – PPO | Admitting: Obstetrics

## 2023-08-17 DIAGNOSIS — O4391 Unspecified placental disorder, first trimester: Secondary | ICD-10-CM | POA: Diagnosis present

## 2023-08-17 DIAGNOSIS — O34219 Maternal care for unspecified type scar from previous cesarean delivery: Secondary | ICD-10-CM | POA: Diagnosis not present

## 2023-08-17 DIAGNOSIS — F17210 Nicotine dependence, cigarettes, uncomplicated: Secondary | ICD-10-CM | POA: Diagnosis not present

## 2023-08-17 DIAGNOSIS — Z3A19 19 weeks gestation of pregnancy: Secondary | ICD-10-CM | POA: Diagnosis not present

## 2023-08-17 DIAGNOSIS — Z362 Encounter for other antenatal screening follow-up: Secondary | ICD-10-CM

## 2023-08-17 DIAGNOSIS — O43102 Malformation of placenta, unspecified, second trimester: Secondary | ICD-10-CM | POA: Diagnosis not present

## 2023-08-17 DIAGNOSIS — O4392 Unspecified placental disorder, second trimester: Secondary | ICD-10-CM

## 2023-08-17 DIAGNOSIS — O358XX0 Maternal care for other (suspected) fetal abnormality and damage, not applicable or unspecified: Secondary | ICD-10-CM | POA: Insufficient documentation

## 2023-08-17 DIAGNOSIS — O99332 Smoking (tobacco) complicating pregnancy, second trimester: Secondary | ICD-10-CM

## 2023-08-17 NOTE — Progress Notes (Signed)
MFM Consult Note  Paula Patton is currently at 19 weeks and 1 day.  She was seen for a detailed fetal anatomy scan due to a placental cyst that was noted earlier in her pregnancy.    She has a history of 3 prior cesarean deliveries.    She has declined all screening tests for fetal aneuploidy in her current pregnancy.  She was informed that the fetal growth and amniotic fluid level were appropriate for her gestational age.   There were no obvious fetal anomalies noted on today's ultrasound exam.  The views of the fetal anatomy were limited today due to the fetal position.  The limitations of ultrasound in the detection of all anomalies was discussed.  The 3 cm complex appearing fluid filled placental cyst continues to be noted on the fetal surface of the posterior placenta near the fundus of her uterus.  There continues to be no blood flow noted within the cystic structure.  Based on the appearance of the cyst, it is unlikely to be a chorioangioma.  The patient was reassured that the cystic structure will probably not affect her fetus.    There were no signs of placenta accreta noted today.    Due to the cystic structure on the surface of the placenta, we will continue to follow her with growth ultrasounds throughout her pregnancy.    A follow-up exam was scheduled in 5 weeks.  The patient stated that all of her questions were answered today.  A total of 20 minutes was spent counseling and coordinating the care for this patient.  Greater than 50% of the time was spent in direct face-to-face contact.

## 2023-08-18 ENCOUNTER — Inpatient Hospital Stay (HOSPITAL_COMMUNITY)
Admission: AD | Admit: 2023-08-18 | Discharge: 2023-08-19 | Disposition: A | Discharge status: Home / Self Care | Payer: BC Managed Care – PPO | Attending: Obstetrics and Gynecology | Admitting: Obstetrics and Gynecology

## 2023-08-18 DIAGNOSIS — Z3A19 19 weeks gestation of pregnancy: Secondary | ICD-10-CM | POA: Diagnosis not present

## 2023-08-18 DIAGNOSIS — O219 Vomiting of pregnancy, unspecified: Secondary | ICD-10-CM | POA: Insufficient documentation

## 2023-08-18 LAB — URINALYSIS, ROUTINE W REFLEX MICROSCOPIC
Bacteria, UA: NONE SEEN
Bilirubin Urine: NEGATIVE
Glucose, UA: NEGATIVE mg/dL
Ketones, ur: NEGATIVE mg/dL
Leukocytes,Ua: NEGATIVE
Nitrite: NEGATIVE
Protein, ur: NEGATIVE mg/dL
Specific Gravity, Urine: 1.014 (ref 1.005–1.030)
pH: 6 (ref 5.0–8.0)

## 2023-08-18 MED ORDER — LIDOCAINE VISCOUS HCL 2 % MT SOLN
15.0000 mL | Freq: Once | OROMUCOSAL | Status: AC
  Administered 2023-08-19: 15 mL via ORAL
  Filled 2023-08-18: qty 15

## 2023-08-18 MED ORDER — ONDANSETRON 4 MG PO TBDP
8.0000 mg | ORAL_TABLET | Freq: Once | ORAL | Status: AC
  Administered 2023-08-18: 8 mg via ORAL
  Filled 2023-08-18: qty 2

## 2023-08-18 MED ORDER — ALUM & MAG HYDROXIDE-SIMETH 200-200-20 MG/5ML PO SUSP
30.0000 mL | Freq: Once | ORAL | Status: AC
  Administered 2023-08-19: 30 mL via ORAL
  Filled 2023-08-18: qty 30

## 2023-08-18 NOTE — MAU Provider Note (Signed)
None     S Ms. Varina Hulon is a 33 y.o. [phone] pregnant female at [redacted]w[redacted]d who presents to MAU today with complaint of nausea and vomiting, throughout pregancy. Worse in the last 3 days. Has not taken anything. She has diclegis and phenergan prescribed for her by her primary OB.  Receives care at [facility], also followed by MFM (cyst on placenta). Prenatal records reviewed.  Pertinent items noted in HPI and remainder of comprehensive ROS otherwise negative.   O BP 119/65 (BP Location: Right Arm)   Pulse (!) 102   Temp 98.5 F (36.9 C) (Oral)   Resp 16   Ht 5' (1.524 m)   Wt 70.8 kg   LMP 04/05/2023   BMI 30.47 kg/m  Physical Exam Vitals reviewed.  Constitutional:      Appearance: Normal appearance.  HENT:     Head: Normocephalic.  Cardiovascular:     Rate and Rhythm: Normal rate.     Pulses: Normal pulses.  Pulmonary:     Effort: Pulmonary effort is normal.  Skin:    General: Skin is warm and dry.     Capillary Refill: Capillary refill takes less than 2 seconds.  Neurological:     Mental Status: She is alert and oriented to person, place, and time.  Psychiatric:        Mood and Affect: Mood normal.        Behavior: Behavior normal.        Thought Content: Thought content normal.        Judgment: Judgment normal.      MDM: History Medication administration and response evaluation  MAU Course:  A Nausea and vomiting of pregnancy, antepartum - Plan: Discharge patient  Medical screening exam complete  P - Zofran and pepcid sent for nausea and GERD symptoms.  - Safe medications in pregnancy list provided.   Discharge from MAU in stable condition with routine precautions Follow up at [facility] as scheduled for ongoing prenatal care  Allergies as of 08/19/2023   No Known Allergies      Medication List     STOP taking these medications    acetaminophen-codeine 120-12 MG/5ML solution       TAKE these medications    famotidine 20 MG tablet Commonly  known as: Pepcid Take 1 tablet (20 mg total) by mouth daily.   multivitamin-prenatal 27-0.8 MG Tabs tablet Take 1 tablet by mouth daily at 12 noon.   ondansetron 4 MG tablet Commonly known as: Zofran Take 1 tablet (4 mg total) by mouth every 8 (eight) hours as needed for nausea or vomiting.        Lamont Snowball, MSN, CNM 08/19/2023 1:05 AM  Certified Nurse Midwife, [facility]

## 2023-08-18 NOTE — MAU Note (Signed)
Pt says she has been vomiting all of preg. . Not been here before. [facility] Dr Katrinka Blazing. Rx- sent -in but pt didn't pick up . Has only been taking B6.

## 2023-08-19 DIAGNOSIS — O219 Vomiting of pregnancy, unspecified: Secondary | ICD-10-CM | POA: Diagnosis not present

## 2023-08-19 MED ORDER — FAMOTIDINE 20 MG PO TABS: 20.0000 mg | ORAL_TABLET | Freq: Every day | ORAL | 0 refills | Status: DC

## 2023-08-19 MED ORDER — ONDANSETRON HCL 4 MG PO TABS: 4.0000 mg | ORAL_TABLET | Freq: Three times a day (TID) | ORAL | 0 refills | Status: DC | PRN

## 2023-08-19 NOTE — Discharge Instructions (Signed)
You were seen tonight for nausea and vomiting in pregnancy. It is important to take the medications that have been prescribed to you as directed to keep this under control. I recommend starting with the medications prescribed by your primary OB and consider the ones I send tonight for breakthrough symptoms. I am sending zofran for nausea and pepcid for heartburn (acid reflux). The pepcid you should take everyday for heartburn symptoms. You can get a refill of this or another medication from your primary OB.  Thank you for trusting [place] with your care, Huntley Dec, Midwife

## 2023-08-22 DIAGNOSIS — Z419 Encounter for procedure for purposes other than remedying health state, unspecified: Secondary | ICD-10-CM | POA: Diagnosis not present

## 2023-09-19 DIAGNOSIS — Z419 Encounter for procedure for purposes other than remedying health state, unspecified: Secondary | ICD-10-CM | POA: Diagnosis not present

## 2023-09-23 ENCOUNTER — Other Ambulatory Visit: Payer: Self-pay | Admitting: *Deleted

## 2023-09-23 ENCOUNTER — Ambulatory Visit: Payer: BC Managed Care – PPO | Attending: Obstetrics

## 2023-09-23 DIAGNOSIS — Z362 Encounter for other antenatal screening follow-up: Secondary | ICD-10-CM | POA: Insufficient documentation

## 2023-09-23 DIAGNOSIS — Z3A24 24 weeks gestation of pregnancy: Secondary | ICD-10-CM

## 2023-09-23 DIAGNOSIS — O43192 Other malformation of placenta, second trimester: Secondary | ICD-10-CM

## 2023-09-23 DIAGNOSIS — F17200 Nicotine dependence, unspecified, uncomplicated: Secondary | ICD-10-CM | POA: Diagnosis not present

## 2023-09-23 DIAGNOSIS — O99332 Smoking (tobacco) complicating pregnancy, second trimester: Secondary | ICD-10-CM | POA: Diagnosis not present

## 2023-10-21 ENCOUNTER — Ambulatory Visit (HOSPITAL_BASED_OUTPATIENT_CLINIC_OR_DEPARTMENT_OTHER)

## 2023-10-21 ENCOUNTER — Ambulatory Visit: Attending: Maternal & Fetal Medicine

## 2023-10-21 ENCOUNTER — Other Ambulatory Visit: Payer: Self-pay | Admitting: *Deleted

## 2023-10-21 DIAGNOSIS — O283 Abnormal ultrasonic finding on antenatal screening of mother: Secondary | ICD-10-CM

## 2023-10-21 DIAGNOSIS — O43103 Malformation of placenta, unspecified, third trimester: Secondary | ICD-10-CM

## 2023-10-21 DIAGNOSIS — O43192 Other malformation of placenta, second trimester: Secondary | ICD-10-CM | POA: Diagnosis not present

## 2023-10-21 DIAGNOSIS — Z3A28 28 weeks gestation of pregnancy: Secondary | ICD-10-CM | POA: Diagnosis not present

## 2023-10-21 DIAGNOSIS — O365930 Maternal care for other known or suspected poor fetal growth, third trimester, not applicable or unspecified: Secondary | ICD-10-CM

## 2023-10-21 DIAGNOSIS — O99333 Smoking (tobacco) complicating pregnancy, third trimester: Secondary | ICD-10-CM

## 2023-10-21 DIAGNOSIS — F17210 Nicotine dependence, cigarettes, uncomplicated: Secondary | ICD-10-CM

## 2023-10-21 NOTE — Progress Notes (Signed)
   Patient information  Patient Name: Paula Patton  Patient MRN:   [id number]  Referring practice: MFM Referring Provider: Willodean Rosenthal  MFM CONSULT  ILLENE Patton is a 33 y.o. [phone] at [redacted]w[redacted]d here for ultrasound and consultation. Patient Active Problem List   Diagnosis Date Noted   Placental abnormality, third trimester 06/17/2023   Pregnancy with history of cesarean section, antepartum 06/17/2023   Paula Patton has a pregnancy with the complications mentioned in the problem list. During today's visit we focused on the following concerns:   RE fetal placental cysts: The fetal placental cyst was examined again today and appears of stable size approximately 5 to 6 cm.  There is no evidence of vascular supply or septation.  I reassured the patient that this is generally associated with a favorable outcome but future ultrasound should be done to assess for rapid growth of the cyst.  RE poor fetal growth: The estimated fetal weight is at the 13th percentile with the abdominal circumference at the 37th percentile.  This represents the lower end of normal fetal growth.  I recommended the patient that she should have a growth ultrasound in 4 weeks to assess fetal biometry and rule out fetal growth restriction.  Sonographic findings Single intrauterine pregnancy at 28w 3d.  Fetal cardiac activity:  Observed and appears normal. Presentation: Cephalic. Interval fetal anatomy appears normal. Fetal biometry shows the estimated fetal weight at the 13 percentile. Amniotic fluid volume: Within normal limits. MVP: 5.1 cm. Placenta: Posterior.  There are limitations of prenatal ultrasound such as the inability to detect certain abnormalities due to poor visualization. Various factors such as fetal position, gestational age and maternal body habitus may increase the difficulty in visualizing the fetal anatomy.    Recommendations -Continue monthly growth scans. -Continue routine prenatal care  with referring OB provider  Review of Systems: A review of systems was performed and was negative except per HPI   Vitals and Physical Exam    08/18/2023    9:48 PM 08/17/2023    9:29 AM 06/29/2023    2:32 PM  Vitals with BMI  Height 5\' 0"     Weight 156 lbs    BMI 30.47    Systolic 119 105 401  Diastolic 65 57 65  Pulse 102 88 87  Sitting comfortably on the sonogram table Nonlabored breathing Normal rate and rhythm Abdomen is nontender  Past pregnancies OB History  Gravida Para Term Preterm AB Living  4 3 3   3   SAB IAB Ectopic Multiple Live Births      3    # Outcome Date GA Lbr Len/2nd Weight Sex Type Anes PTL Lv  4 Current           3 Term      CS-LTranv   LIV  2 Term      CS-LTranv   LIV  1 Term      CS-LTranv   LIV     I spent 10 minutes reviewing the patients chart, including labs and images as well as counseling the patient about her medical conditions. Greater than 50% of the time was spent in direct face-to-face patient counseling.  Braxton Feathers, DO Maternal fetal medicine, [place]   10/21/2023  4:02 PM

## 2023-10-24 IMAGING — DX DG ANKLE COMPLETE 3+V*R*
3 series · 3 of 3 positions shown · non-contrast
Comparison: None.

CLINICAL DATA: Ankle injury

EXAM:
RIGHT ANKLE - COMPLETE 3+ VIEW

[ankle ap]
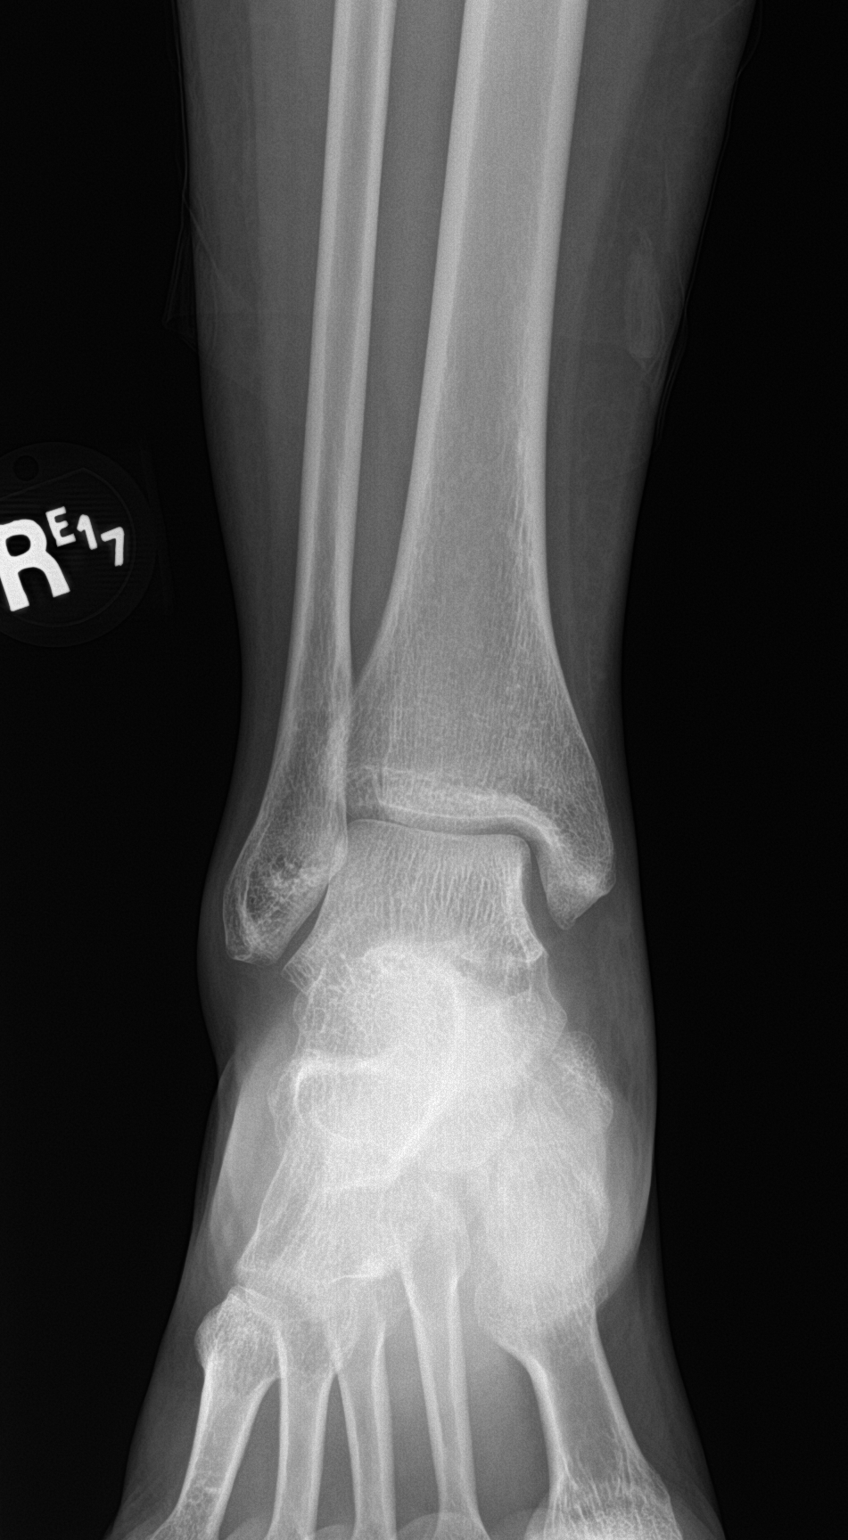

[ankle obl]
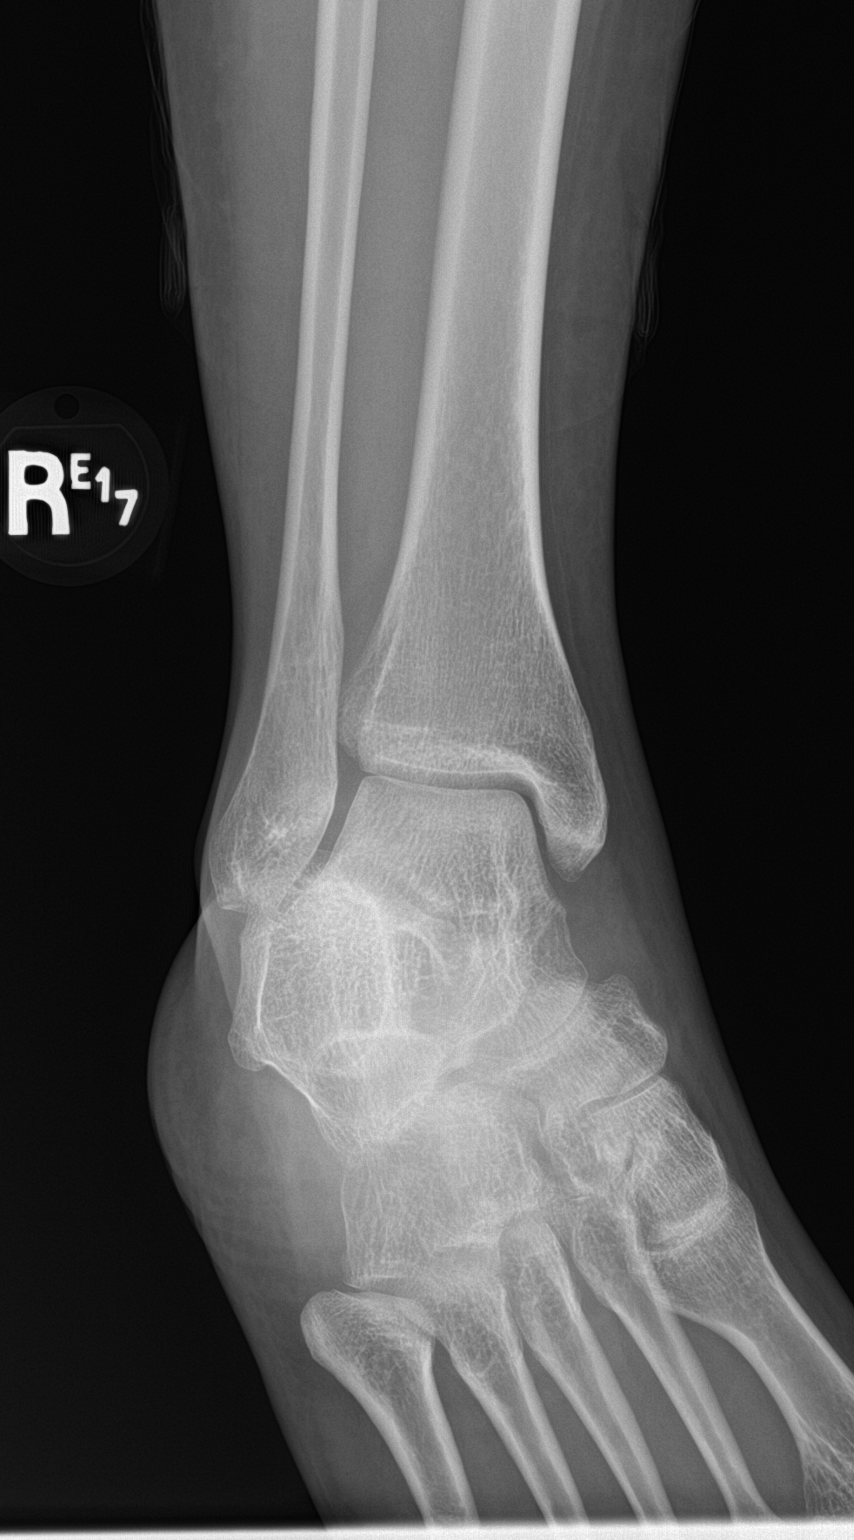

[ankle lat]
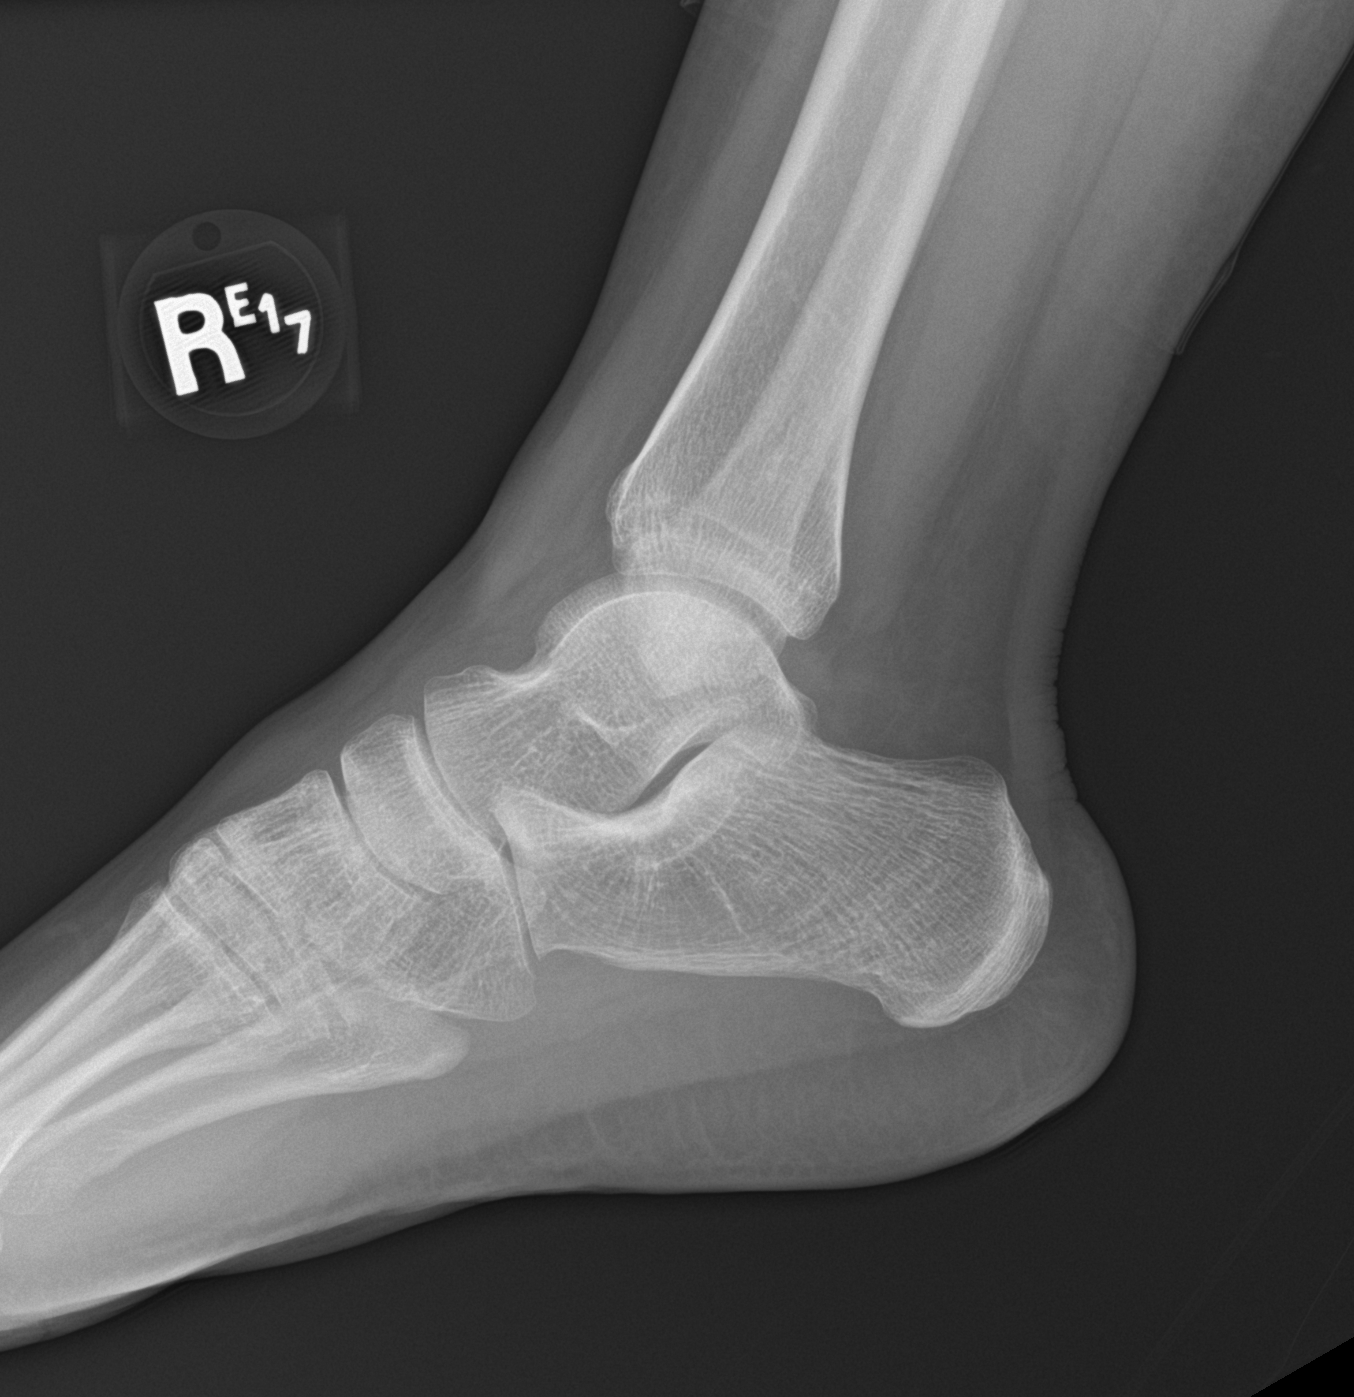

[3 of 3 positions shown; findings below may reference images not displayed]

FINDINGS: Alignment is anatomic. No acute fracture. Joint spaces are
preserved.
IMPRESSION: No acute fracture.

## 2023-10-30 ENCOUNTER — Inpatient Hospital Stay (HOSPITAL_COMMUNITY)
Admission: AD | Admit: 2023-10-30 | Discharge: 2023-10-30 | Disposition: A | Discharge status: Home / Self Care | Attending: Obstetrics and Gynecology | Admitting: Obstetrics and Gynecology

## 2023-10-30 ENCOUNTER — Encounter (HOSPITAL_COMMUNITY): Payer: Self-pay | Admitting: Obstetrics and Gynecology

## 2023-10-30 DIAGNOSIS — O99613 Diseases of the digestive system complicating pregnancy, third trimester: Secondary | ICD-10-CM | POA: Diagnosis not present

## 2023-10-30 DIAGNOSIS — O26893 Other specified pregnancy related conditions, third trimester: Secondary | ICD-10-CM | POA: Insufficient documentation

## 2023-10-30 DIAGNOSIS — Z3A29 29 weeks gestation of pregnancy: Secondary | ICD-10-CM | POA: Diagnosis not present

## 2023-10-30 DIAGNOSIS — K219 Gastro-esophageal reflux disease without esophagitis: Secondary | ICD-10-CM | POA: Diagnosis not present

## 2023-10-30 DIAGNOSIS — R102 Pelvic and perineal pain: Secondary | ICD-10-CM | POA: Diagnosis not present

## 2023-10-30 DIAGNOSIS — Z3689 Encounter for other specified antenatal screening: Secondary | ICD-10-CM

## 2023-10-30 DIAGNOSIS — M549 Dorsalgia, unspecified: Secondary | ICD-10-CM

## 2023-10-30 DIAGNOSIS — Z87891 Personal history of nicotine dependence: Secondary | ICD-10-CM | POA: Insufficient documentation

## 2023-10-30 HISTORY — DX: Other specified health status: Z78.9

## 2023-10-30 LAB — URINALYSIS, ROUTINE W REFLEX MICROSCOPIC
Bacteria, UA: NONE SEEN
Bilirubin Urine: NEGATIVE
Glucose, UA: NEGATIVE mg/dL
Ketones, ur: 80 mg/dL — AB
Leukocytes,Ua: NEGATIVE
Nitrite: NEGATIVE
Protein, ur: NEGATIVE mg/dL
Specific Gravity, Urine: 1.014 (ref 1.005–1.030)
pH: 7 (ref 5.0–8.0)

## 2023-10-30 LAB — WET PREP, GENITAL
Clue Cells Wet Prep HPF POC: NONE SEEN
Sperm: NONE SEEN
Trich, Wet Prep: NONE SEEN
WBC, Wet Prep HPF POC: <10 (ref <10)
Yeast Wet Prep HPF POC: NONE SEEN

## 2023-10-30 LAB — FETAL FIBRONECTIN: Fetal Fibronectin: NEGATIVE

## 2023-10-30 MED ORDER — CYCLOBENZAPRINE HCL 10 MG PO TABS: 10.0000 mg | ORAL_TABLET | Freq: Three times a day (TID) | ORAL | 1 refills | Status: DC | PRN

## 2023-10-30 MED ORDER — FAMOTIDINE 20 MG PO TABS
20.0000 mg | ORAL_TABLET | Freq: Once | ORAL | Status: AC
  Administered 2023-10-30: 20 mg via ORAL
  Filled 2023-10-30: qty 1

## 2023-10-30 MED ORDER — CALCIUM CARBONATE ANTACID 500 MG PO CHEW
400.0000 mg | CHEWABLE_TABLET | Freq: Every day | ORAL | Status: DC
Start: 2023-10-30 — End: 2023-10-31
  Administered 2023-10-30: 400 mg via ORAL
  Filled 2023-10-30: qty 2

## 2023-10-30 MED ORDER — CYCLOBENZAPRINE HCL 5 MG PO TABS
10.0000 mg | ORAL_TABLET | Freq: Once | ORAL | Status: AC
  Administered 2023-10-30: 10 mg via ORAL
  Filled 2023-10-30: qty 2

## 2023-10-30 MED ORDER — ACETAMINOPHEN 500 MG PO TABS
1000.0000 mg | ORAL_TABLET | Freq: Once | ORAL | Status: AC
  Administered 2023-10-30: 1000 mg via ORAL
  Filled 2023-10-30: qty 2

## 2023-10-30 NOTE — MAU Provider Note (Signed)
 Chief Complaint:  Pelvic Pain and Back Pain  HPI  Paula Patton is a 33 y.o. [id number] at [redacted]w[redacted]d who presents to maternity admissions reporting low back pain that radiates and pulls on her lower abdomen and shoots into her vagina and down her legs. Started today, talked to her OB about it but they were not concerned. Denies LOF or vaginal bleeding, baby is moving well. No other physical complaints.   Pregnancy Course: Receives care at [facility], uncomplicated preg other than 3 prior CS.  Past Medical History:  Diagnosis Date   Medical history non-contributory    OB History  Gravida Para Term Preterm AB Living  4 3 3   3   SAB IAB Ectopic Multiple Live Births      3    # Outcome Date GA Lbr Len/2nd Weight Sex Type Anes PTL Lv  4 Current           3 Term      CS-LTranv   LIV  2 Term      CS-LTranv   LIV  1 Term      CS-LTranv   LIV   Past Surgical History:  Procedure Laterality Date   CESAREAN SECTION     No family history on file. Social History   Tobacco Use   Smoking status: Former    Current packs/day: 0.50    Types: Cigarettes   Smokeless tobacco: Never  Vaping Use   Vaping status: Never Used  Substance Use Topics   Alcohol use: No   Drug use: No   No Known Allergies Medications Prior to Admission  Medication Sig Dispense Refill Last Dose/Taking   acetaminophen (TYLENOL) 650 MG CR tablet Take 650 mg by mouth every 8 (eight) hours as needed for pain.   Taking As Needed   ondansetron (ZOFRAN) 4 MG tablet Take 1 tablet (4 mg total) by mouth every 8 (eight) hours as needed for nausea or vomiting. 20 tablet 0 10/30/2023   Prenatal Vit-Fe Fumarate-FA (MULTIVITAMIN-PRENATAL) 27-0.8 MG TABS tablet Take 1 tablet by mouth daily at 12 noon.   10/30/2023   famotidine (PEPCID) 20 MG tablet Take 1 tablet (20 mg total) by mouth daily. 30 tablet 0    I have reviewed patient's Past Medical Hx, Surgical Hx, Family Hx, Social Hx, medications and allergies.   ROS  Pertinent  items noted in HPI and remainder of comprehensive ROS otherwise negative.   PHYSICAL EXAM  Patient Vitals for the past 24 hrs:  BP Pulse Resp Height Weight  10/30/23 1807 122/69 (!) 113 18 5' (1.524 m) 160 lb 3.2 oz (72.7 kg)   Constitutional: Well-developed, well-nourished female in no acute distress.  Cardiovascular: normal rate & rhythm, warm and well-perfused Respiratory: normal effort, no problems with respiration noted GI: Abd soft, non-tender, non-distended MS: Extremities nontender, no edema, normal ROM Neurologic: Alert and oriented x 4.  GU: no CVA tenderness Pelvic: NEFG, physiologic discharge, no blood, cervix clean.   Dilation: Closed Effacement (%): Thick Cervical Position: Posterior Presentation: Vertex Exam by:: J.Cindie Rajagopalan,CNM  Fetal Tracing: reactive Baseline: 155 Variability: moderate Accelerations: 15x15 Decelerations: none Toco: relaxed   Labs: Results for orders placed or performed during the hospital encounter of 10/30/23 (from the past 24 hours)  Urinalysis, Routine w reflex microscopic -Urine, Clean Catch     Status: Abnormal   Collection Time: 10/30/23  6:13 PM  Result Value Ref Range   Color, Urine YELLOW YELLOW   APPearance CLEAR CLEAR   Specific Gravity, Urine  1.014 1.005 - 1.030   pH 7.0 5.0 - 8.0   Glucose, UA NEGATIVE NEGATIVE mg/dL   Hgb urine dipstick SMALL (A) NEGATIVE   Bilirubin Urine NEGATIVE NEGATIVE   Ketones, ur 80 (A) NEGATIVE mg/dL   Protein, ur NEGATIVE NEGATIVE mg/dL   Nitrite NEGATIVE NEGATIVE   Leukocytes,Ua NEGATIVE NEGATIVE   RBC / HPF 0-5 0 - 5 RBC/hpf   WBC, UA 0-5 0 - 5 WBC/hpf   Bacteria, UA NONE SEEN NONE SEEN   Squamous Epithelial / HPF 0-5 0 - 5 /HPF   Mucus PRESENT   Wet prep, genital     Status: None   Collection Time: 10/30/23  6:52 PM  Result Value Ref Range   Yeast Wet Prep HPF POC NONE SEEN NONE SEEN   Trich, Wet Prep NONE SEEN NONE SEEN   Clue Cells Wet Prep HPF POC NONE SEEN NONE SEEN   WBC, Wet Prep  HPF POC <10 <10   Sperm NONE SEEN   Fetal fibronectin     Status: None   Collection Time: 10/30/23  6:52 PM  Result Value Ref Range   Fetal Fibronectin NEGATIVE NEGATIVE   Imaging:  No results found.  MDM & MAU COURSE  MDM: Moderate  MAU Course: Orders Placed This Encounter  Procedures   Wet prep, genital   Urinalysis, Routine w reflex microscopic -Urine, Clean Catch   Fetal fibronectin   Discharge patient   Meds ordered this encounter  Medications   acetaminophen (TYLENOL) tablet 1,000 mg   cyclobenzaprine (FLEXERIL) tablet 10 mg   calcium carbonate (TUMS - dosed in mg elemental calcium) chewable tablet 400 mg of elemental calcium   famotidine (PEPCID) tablet 20 mg   cyclobenzaprine (FLEXERIL) 10 MG tablet    Sig: Take 1 tablet (10 mg total) by mouth every 8 (eight) hours as needed for muscle spasms.    Dispense:  30 tablet    Refill:  1   FFN and swabs collected (negative), cervix closed and very posterior but baby low.  Offered tylenol+flexeril which relieved pain. Requested something for reflux - gave TUMS and pepcid. Stable for discharge home. Sent script for flexeril.   ASSESSMENT   1. Pelvic pain   2. NST (non-stress test) reactive   3. [redacted] weeks gestation of pregnancy   4. Gastroesophageal reflux in pregnancy    PLAN  Discharge home in stable condition with return precautions.     Follow-up Information     [facility] Follow up.   Why: as scheduled for ongoing prenatal care Contact information: [address]  [address] [place] [address] [phone]                 Allergies as of 10/30/2023   No Known Allergies      Medication List     TAKE these medications    acetaminophen 650 MG CR tablet Commonly known as: TYLENOL Take 650 mg by mouth every 8 (eight) hours as needed for pain.   cyclobenzaprine 10 MG tablet Commonly known as: FLEXERIL Take 1 tablet (10 mg total) by mouth every 8 (eight) hours as needed for  muscle spasms.   famotidine 20 MG tablet Commonly known as: Pepcid Take 1 tablet (20 mg total) by mouth daily.   multivitamin-prenatal 27-0.8 MG Tabs tablet Take 1 tablet by mouth daily at 12 noon.   ondansetron 4 MG tablet Commonly known as: Zofran Take 1 tablet (4 mg total) by mouth every 8 (eight)  hours as needed for nausea or vomiting.       Edd Arbour, CNM, MSN, IBCLC Certified Nurse Midwife, [facility]

## 2023-10-30 NOTE — MAU Note (Signed)
.  Paula Patton is a 33 y.o. at [redacted]w[redacted]d here in MAU reporting: woke up this morning with a vaginal pain and back pain. Went to [facility] appointment told her OB but they did nto "check" anything.  Pt went home took some tylenol and tried to rest. Pain got a little better until she went to pick up hr kids from school. Back pain became constant and pain in her pelvis and vaginal is sharp and comes and goes and shoots down  to her legs. Stated pain is like ctx but she is not sure Denies any vag bleeding or discharge and good fetal movement reported.   LMP:  Onset of complaint: this morning Pain score: 8-9 Vitals:   10/30/23 1807  BP: 122/69  Pulse: (!) 113  Resp: 18     FHT: 150  Lab orders placed from triage: u/a

## 2023-10-30 NOTE — Discharge Instructions (Signed)
Please drink a lot of water

## 2023-10-31 DIAGNOSIS — Z419 Encounter for procedure for purposes other than remedying health state, unspecified: Secondary | ICD-10-CM | POA: Diagnosis not present

## 2023-11-02 LAB — GC/CHLAMYDIA PROBE AMP (~~LOC~~) NOT AT ARMC
Chlamydia: NEGATIVE
Comment: NEGATIVE
Comment: NORMAL
Neisseria Gonorrhea: NEGATIVE

## 2023-11-13 DIAGNOSIS — O283 Abnormal ultrasonic finding on antenatal screening of mother: Secondary | ICD-10-CM | POA: Insufficient documentation

## 2023-11-18 ENCOUNTER — Ambulatory Visit

## 2023-11-19 ENCOUNTER — Other Ambulatory Visit: Payer: Self-pay

## 2023-11-19 ENCOUNTER — Inpatient Hospital Stay (HOSPITAL_COMMUNITY)
Admission: EM | Admit: 2023-11-19 | Discharge: 2023-11-20 | Disposition: A | Discharge status: Home / Self Care | Attending: Obstetrics and Gynecology | Admitting: Obstetrics and Gynecology

## 2023-11-19 ENCOUNTER — Encounter (HOSPITAL_COMMUNITY): Payer: Self-pay | Admitting: Obstetrics and Gynecology

## 2023-11-19 DIAGNOSIS — Z3A32 32 weeks gestation of pregnancy: Secondary | ICD-10-CM | POA: Diagnosis not present

## 2023-11-19 DIAGNOSIS — O26893 Other specified pregnancy related conditions, third trimester: Secondary | ICD-10-CM | POA: Insufficient documentation

## 2023-11-19 DIAGNOSIS — M542 Cervicalgia: Secondary | ICD-10-CM | POA: Diagnosis not present

## 2023-11-19 DIAGNOSIS — Z3689 Encounter for other specified antenatal screening: Secondary | ICD-10-CM

## 2023-11-19 DIAGNOSIS — M545 Low back pain, unspecified: Secondary | ICD-10-CM | POA: Insufficient documentation

## 2023-11-19 DIAGNOSIS — Z3A33 33 weeks gestation of pregnancy: Secondary | ICD-10-CM | POA: Insufficient documentation

## 2023-11-19 LAB — CBC WITH DIFFERENTIAL/PLATELET
Abs Immature Granulocytes: 0.2 10*3/uL — ABNORMAL HIGH (ref 0.00–0.07)
Basophils Absolute: 0.1 10*3/uL (ref 0.0–0.1)
Basophils Relative: 0 %
Eosinophils Absolute: 0.1 10*3/uL (ref 0.0–0.5)
Eosinophils Relative: 1 %
HCT: 31.9 % — ABNORMAL LOW (ref 36.0–46.0)
Hemoglobin: 10.6 g/dL — ABNORMAL LOW (ref 12.0–15.0)
Immature Granulocytes: 1 %
Lymphocytes Relative: 18 %
Lymphs Abs: 3.1 10*3/uL (ref 0.7–4.0)
MCH: 29 pg (ref 26.0–34.0)
MCHC: 33.2 g/dL (ref 30.0–36.0)
MCV: 87.4 fL (ref 80.0–100.0)
Monocytes Absolute: 0.9 10*3/uL (ref 0.1–1.0)
Monocytes Relative: 5 %
Neutro Abs: 12.6 10*3/uL — ABNORMAL HIGH (ref 1.7–7.7)
Neutrophils Relative %: 75 %
Platelets: 348 10*3/uL (ref 150–400)
RBC: 3.65 MIL/uL — ABNORMAL LOW (ref 3.87–5.11)
RDW: 13.7 % (ref 11.5–15.5)
WBC: 17 10*3/uL — ABNORMAL HIGH (ref 4.0–10.5)
nRBC: 0 % (ref 0.0–0.2)

## 2023-11-19 MED ORDER — ONDANSETRON HCL 4 MG/2ML IJ SOLN
4.0000 mg | Freq: Once | INTRAMUSCULAR | Status: AC
  Administered 2023-11-19: 4 mg via INTRAVENOUS
  Filled 2023-11-19: qty 2

## 2023-11-19 MED ORDER — ACETAMINOPHEN 500 MG PO TABS
1000.0000 mg | ORAL_TABLET | Freq: Once | ORAL | Status: AC
  Administered 2023-11-19: 1000 mg via ORAL
  Filled 2023-11-19: qty 2

## 2023-11-19 MED ORDER — SODIUM CHLORIDE 0.9 % IV BOLUS
1000.0000 mL | Freq: Once | INTRAVENOUS | Status: AC
  Administered 2023-11-19: 1000 mL via INTRAVENOUS
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
## 2023-11-19 NOTE — ED Notes (Signed)
OB RR RN at bedside. 

## 2023-11-19 NOTE — ED Notes (Signed)
OB Rapid Response notified. 
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
## 2023-11-20 DIAGNOSIS — O26893 Other specified pregnancy related conditions, third trimester: Secondary | ICD-10-CM | POA: Diagnosis not present

## 2023-11-20 DIAGNOSIS — Z3A32 32 weeks gestation of pregnancy: Secondary | ICD-10-CM

## 2023-11-30 DIAGNOSIS — Z419 Encounter for procedure for purposes other than remedying health state, unspecified: Secondary | ICD-10-CM | POA: Diagnosis not present

## 2023-12-16 ENCOUNTER — Ambulatory Visit: Attending: Maternal & Fetal Medicine

## 2023-12-16 ENCOUNTER — Ambulatory Visit: Admitting: Obstetrics and Gynecology

## 2023-12-16 VITALS — BP 128/87 | HR 84

## 2023-12-16 DIAGNOSIS — Z3A36 36 weeks gestation of pregnancy: Secondary | ICD-10-CM | POA: Insufficient documentation

## 2023-12-16 DIAGNOSIS — F17210 Nicotine dependence, cigarettes, uncomplicated: Secondary | ICD-10-CM | POA: Diagnosis not present

## 2023-12-16 DIAGNOSIS — O283 Abnormal ultrasonic finding on antenatal screening of mother: Secondary | ICD-10-CM | POA: Diagnosis present

## 2023-12-16 DIAGNOSIS — O365930 Maternal care for other known or suspected poor fetal growth, third trimester, not applicable or unspecified: Secondary | ICD-10-CM | POA: Diagnosis present

## 2023-12-16 DIAGNOSIS — O34219 Maternal care for unspecified type scar from previous cesarean delivery: Secondary | ICD-10-CM

## 2023-12-16 DIAGNOSIS — O43103 Malformation of placenta, unspecified, third trimester: Secondary | ICD-10-CM

## 2023-12-16 DIAGNOSIS — O99333 Smoking (tobacco) complicating pregnancy, third trimester: Secondary | ICD-10-CM

## 2023-12-16 NOTE — Progress Notes (Signed)
 Maternal-Fetal Medicine Consultation Name: Paula Patton MRN: [id number]  G4 P3003 at 36w 3d gestation. Patient is here for fetal growth assessment and evaluation of placental cyst. Obstetrical history significant for 3 term cesarean delivery and patient will be undergoing repeat cesarean delivery at [redacted] weeks gestation.  Ultrasound Fetal growth is appropriate for gestational age.  Amniotic fluid is normal and good fetal activity is seen.  Preplacental cyst is seen again and measures 6 cm, which is essentially unchanged from previous ultrasound.  It is not close to the umbilical cord insertion. Color Doppler flow did not show increased vascularity.  I reassured the patient of the findings.  I counseled the patient that the finding of preplacental cyst is not associated with fetal adverse outcomes. We discussed repeat cesarean delivery.  I counseled the patient that repeat cesarean deliveries increase the risks of placenta previa and/or placenta accreta spectrum.  Recommendations -No follow-up appointments were made.  Consultation including face-to-face (more than 50%) counseling 20 minutes.

## 2023-12-18 NOTE — Patient Instructions (Signed)
 Paula Patton  12/18/2023   Your procedure is scheduled on:  01/01/2024  Arrive at 0530 at Entrance C on [address] at [facility]  and [employer]. You are invited to use the FREE valet parking or use the Visitor's parking deck.  Pick up the phone at the desk and dial [phone].  Call this number if you have problems the morning of surgery: [phone]  Remember:   Do not eat food:(After Midnight) Desps de medianoche.  You may drink clear liquids until  ___0330__.  Clear liquids means a liquid you can see thru.  It can have color such as Cola or Kool aid.  Tea is OK and coffee as long as no milk or creamer of any kind.  Take these medicines the morning of surgery with A SIP OF WATER:  none   Do not wear jewelry, make-up or nail polish.  Do not wear lotions, powders, or perfumes. Do not wear deodorant.  Do not shave 48 hours prior to surgery.  Do not bring valuables to the hospital.  [facility] is not   responsible for any belongings or valuables brought to the hospital.  Contacts, dentures or bridgework may not be worn into surgery.  Leave suitcase in the car. After surgery it may be brought to your room.  For patients admitted to the hospital, checkout time is 11:00 AM the day of              discharge.      Please read over the following fact sheets that you were given:     Preparing for Surgery

## 2023-12-21 ENCOUNTER — Encounter (HOSPITAL_COMMUNITY): Payer: Self-pay

## 2023-12-24 ENCOUNTER — Other Ambulatory Visit: Payer: Self-pay

## 2023-12-24 ENCOUNTER — Encounter (HOSPITAL_COMMUNITY): Payer: Self-pay | Admitting: Student

## 2023-12-24 ENCOUNTER — Inpatient Hospital Stay (HOSPITAL_COMMUNITY): Admitting: Anesthesiology

## 2023-12-24 ENCOUNTER — Inpatient Hospital Stay (HOSPITAL_COMMUNITY)
Admission: RE | Admit: 2023-12-24 | Discharge: 2023-12-26 | DRG: 785 | Disposition: A | Discharge status: Home / Self Care | Attending: Student | Admitting: Student

## 2023-12-24 ENCOUNTER — Encounter (HOSPITAL_COMMUNITY)
Admission: RE | Disposition: A | Discharge status: Home / Self Care | Payer: Self-pay | Source: Home / Self Care | Attending: Student

## 2023-12-24 DIAGNOSIS — O134 Gestational [pregnancy-induced] hypertension without significant proteinuria, complicating childbirth: Secondary | ICD-10-CM | POA: Diagnosis present

## 2023-12-24 DIAGNOSIS — O34219 Maternal care for unspecified type scar from previous cesarean delivery: Principal | ICD-10-CM

## 2023-12-24 DIAGNOSIS — Z302 Encounter for sterilization: Secondary | ICD-10-CM

## 2023-12-24 DIAGNOSIS — O3429 Maternal care due to uterine scar from other previous surgery: Secondary | ICD-10-CM | POA: Diagnosis present

## 2023-12-24 DIAGNOSIS — O34211 Maternal care for low transverse scar from previous cesarean delivery: Secondary | ICD-10-CM | POA: Diagnosis present

## 2023-12-24 DIAGNOSIS — O9902 Anemia complicating childbirth: Secondary | ICD-10-CM | POA: Diagnosis present

## 2023-12-24 DIAGNOSIS — Z87891 Personal history of nicotine dependence: Secondary | ICD-10-CM

## 2023-12-24 DIAGNOSIS — Z3A37 37 weeks gestation of pregnancy: Secondary | ICD-10-CM

## 2023-12-24 DIAGNOSIS — Z3490 Encounter for supervision of normal pregnancy, unspecified, unspecified trimester: Secondary | ICD-10-CM

## 2023-12-24 HISTORY — PX: TUBAL LIGATION: SHX77

## 2023-12-24 LAB — COMPREHENSIVE METABOLIC PANEL WITH GFR
ALT: 11 U/L (ref 0–44)
AST: 24 U/L (ref 15–41)
Albumin: 2.4 g/dL — ABNORMAL LOW (ref 3.5–5.0)
Alkaline Phosphatase: 112 U/L (ref 38–126)
Anion gap: 11 (ref 5–15)
BUN: <5 mg/dL — ABNORMAL LOW (ref 6–20)
CO2: 21 mmol/L — ABNORMAL LOW (ref 22–32)
Calcium: 8.8 mg/dL — ABNORMAL LOW (ref 8.9–10.3)
Chloride: 101 mmol/L (ref 98–111)
Creatinine, Ser: 0.66 mg/dL (ref 0.44–1.00)
GFR, Estimated: >60 mL/min (ref >60)
Glucose, Bld: 176 mg/dL — ABNORMAL HIGH (ref 70–99)
Potassium: 3.6 mmol/L (ref 3.5–5.1)
Sodium: 133 mmol/L — ABNORMAL LOW (ref 135–145)
Total Bilirubin: 0.6 mg/dL (ref 0.0–1.2)
Total Protein: 5.9 g/dL — ABNORMAL LOW (ref 6.5–8.1)

## 2023-12-24 LAB — CBC
HCT: 35.4 % — ABNORMAL LOW (ref 36.0–46.0)
Hemoglobin: 11.5 g/dL — ABNORMAL LOW (ref 12.0–15.0)
MCH: 28.3 pg (ref 26.0–34.0)
MCHC: 32.5 g/dL (ref 30.0–36.0)
MCV: 87.2 fL (ref 80.0–100.0)
Platelets: 321 10*3/uL (ref 150–400)
RBC: 4.06 MIL/uL (ref 3.87–5.11)
RDW: 13.4 % (ref 11.5–15.5)
WBC: 15.2 10*3/uL — ABNORMAL HIGH (ref 4.0–10.5)
nRBC: 0 % (ref 0.0–0.2)

## 2023-12-24 LAB — TYPE AND SCREEN
ABO/RH(D): B POS
Antibody Screen: NEGATIVE

## 2023-12-24 LAB — PROTEIN / CREATININE RATIO, URINE
Creatinine, Urine: 36 mg/dL
Protein Creatinine Ratio: 0.31 mg/mg{creat} — ABNORMAL HIGH (ref 0.00–0.15)
Total Protein, Urine: 11 mg/dL

## 2023-12-24 SURGERY — Surgical Case
Anesthesia: Spinal

## 2023-12-24 MED ORDER — ACETAMINOPHEN 500 MG PO TABS
1000.0000 mg | ORAL_TABLET | Freq: Four times a day (QID) | ORAL | Status: DC
  Administered 2023-12-25 – 2023-12-26 (×6): 1000 mg via ORAL
  Filled 2023-12-24 (×6): qty 2

## 2023-12-24 MED ORDER — KETOROLAC TROMETHAMINE 30 MG/ML IJ SOLN
30.0000 mg | Freq: Once | INTRAMUSCULAR | Status: DC | PRN
Start: 2023-12-24 — End: 2023-12-24

## 2023-12-24 MED ORDER — BUPIVACAINE IN DEXTROSE 0.75-8.25 % IT SOLN
INTRATHECAL | Status: DC | PRN
  Administered 2023-12-24: 1.5 mL via INTRATHECAL

## 2023-12-24 MED ORDER — ONDANSETRON HCL 4 MG/2ML IJ SOLN
INTRAMUSCULAR | Status: DC | PRN
Start: 2023-12-24 — End: 2023-12-24
  Administered 2023-12-24: 4 mg via INTRAVENOUS

## 2023-12-24 MED ORDER — OXYCODONE HCL 5 MG PO TABS
5.0000 mg | ORAL_TABLET | ORAL | Status: DC | PRN
  Administered 2023-12-25 – 2023-12-26 (×8): 10 mg via ORAL
  Filled 2023-12-24 (×8): qty 2

## 2023-12-24 MED ORDER — CEFAZOLIN SODIUM-DEXTROSE 2-4 GM/100ML-% IV SOLN
2.0000 g | INTRAVENOUS | Status: AC
  Administered 2023-12-24: 2 g via INTRAVENOUS

## 2023-12-24 MED ORDER — DEXAMETHASONE SODIUM PHOSPHATE 10 MG/ML IJ SOLN
INTRAMUSCULAR | Status: AC
  Filled 2023-12-24: qty 1

## 2023-12-24 MED ORDER — COCONUT OIL OIL: 1.0000 | TOPICAL_OIL | Status: DC | PRN

## 2023-12-24 MED ORDER — DIBUCAINE (PERIANAL) 1 % EX OINT
1.0000 | TOPICAL_OINTMENT | CUTANEOUS | Status: DC | PRN
Start: 2023-12-24 — End: 2023-12-26

## 2023-12-24 MED ORDER — PHENYLEPHRINE HCL-NACL 20-0.9 MG/250ML-% IV SOLN
INTRAVENOUS | Status: AC
  Filled 2023-12-24: qty 250

## 2023-12-24 MED ORDER — DIPHENHYDRAMINE HCL 50 MG/ML IJ SOLN
INTRAMUSCULAR | Status: AC
  Filled 2023-12-24: qty 1

## 2023-12-24 MED ORDER — MEPERIDINE HCL 25 MG/ML IJ SOLN: 6.2500 mg | INTRAMUSCULAR | Status: DC | PRN

## 2023-12-24 MED ORDER — OXYTOCIN-SODIUM CHLORIDE 30-0.9 UT/500ML-% IV SOLN
INTRAVENOUS | Status: AC
  Filled 2023-12-24: qty 500

## 2023-12-24 MED ORDER — SODIUM CHLORIDE 0.9% FLUSH: 3.0000 mL | INTRAVENOUS | Status: DC | PRN

## 2023-12-24 MED ORDER — OXYTOCIN-SODIUM CHLORIDE 30-0.9 UT/500ML-% IV SOLN
INTRAVENOUS | Status: DC | PRN
  Administered 2023-12-24: 300 mL via INTRAVENOUS

## 2023-12-24 MED ORDER — NALOXONE HCL 4 MG/10ML IJ SOLN: 1.0000 ug/kg/h | INTRAVENOUS | Status: DC | PRN

## 2023-12-24 MED ORDER — MENTHOL 3 MG MT LOZG: 1.0000 | LOZENGE | OROMUCOSAL | Status: DC | PRN

## 2023-12-24 MED ORDER — ACETAMINOPHEN 10 MG/ML IV SOLN
INTRAVENOUS | Status: AC
Start: 2023-12-24 — End: ?
  Filled 2023-12-24: qty 100

## 2023-12-24 MED ORDER — ACETAMINOPHEN 500 MG PO TABS: 1000.0000 mg | ORAL_TABLET | Freq: Four times a day (QID) | ORAL | Status: DC

## 2023-12-24 MED ORDER — PHENYLEPHRINE 80 MCG/ML (10ML) SYRINGE FOR IV PUSH (FOR BLOOD PRESSURE SUPPORT)
PREFILLED_SYRINGE | INTRAVENOUS | Status: DC | PRN
  Administered 2023-12-24: 160 ug via INTRAVENOUS

## 2023-12-24 MED ORDER — ACETAMINOPHEN 10 MG/ML IV SOLN
INTRAVENOUS | Status: DC | PRN
Start: 2023-12-24 — End: 2023-12-24
  Administered 2023-12-24: 1000 mg via INTRAVENOUS

## 2023-12-24 MED ORDER — POVIDONE-IODINE 10 % EX SWAB: 2.0000 | Freq: Once | CUTANEOUS | Status: DC

## 2023-12-24 MED ORDER — DIPHENHYDRAMINE HCL 50 MG/ML IJ SOLN
12.5000 mg | INTRAMUSCULAR | Status: DC | PRN
  Administered 2023-12-24: 12.5 mg via INTRAVENOUS

## 2023-12-24 MED ORDER — KETOROLAC TROMETHAMINE 30 MG/ML IJ SOLN
30.0000 mg | Freq: Four times a day (QID) | INTRAMUSCULAR | Status: DC | PRN
  Administered 2023-12-24: 30 mg via INTRAMUSCULAR

## 2023-12-24 MED ORDER — SIMETHICONE 80 MG PO CHEW: 80.0000 mg | CHEWABLE_TABLET | ORAL | Status: DC | PRN

## 2023-12-24 MED ORDER — KETOROLAC TROMETHAMINE 30 MG/ML IJ SOLN
INTRAMUSCULAR | Status: AC
  Filled 2023-12-24: qty 1

## 2023-12-24 MED ORDER — AMISULPRIDE (ANTIEMETIC) 5 MG/2ML IV SOLN: 10.0000 mg | Freq: Once | INTRAVENOUS | Status: DC | PRN

## 2023-12-24 MED ORDER — KETOROLAC TROMETHAMINE 30 MG/ML IJ SOLN: 30.0000 mg | Freq: Four times a day (QID) | INTRAMUSCULAR | Status: DC | PRN

## 2023-12-24 MED ORDER — PHENYLEPHRINE HCL-NACL 20-0.9 MG/250ML-% IV SOLN
INTRAVENOUS | Status: DC | PRN
  Administered 2023-12-24: 60 ug/min via INTRAVENOUS

## 2023-12-24 MED ORDER — SENNOSIDES-DOCUSATE SODIUM 8.6-50 MG PO TABS
2.0000 | ORAL_TABLET | Freq: Every day | ORAL | Status: DC
  Administered 2023-12-25 – 2023-12-26 (×2): 2 via ORAL
  Filled 2023-12-24 (×2): qty 2

## 2023-12-24 MED ORDER — PRENATAL MULTIVITAMIN CH
1.0000 | ORAL_TABLET | Freq: Every day | ORAL | Status: DC
  Administered 2023-12-25 – 2023-12-26 (×2): 1 via ORAL
  Filled 2023-12-24 (×2): qty 1

## 2023-12-24 MED ORDER — SCOPOLAMINE 1 MG/3DAYS TD PT72
1.0000 | MEDICATED_PATCH | Freq: Once | TRANSDERMAL | Status: DC
  Administered 2023-12-24: 1.5 mg via TRANSDERMAL

## 2023-12-24 MED ORDER — NALOXONE HCL 0.4 MG/ML IJ SOLN: 0.4000 mg | INTRAMUSCULAR | Status: DC | PRN

## 2023-12-24 MED ORDER — TRANEXAMIC ACID-NACL 1000-0.7 MG/100ML-% IV SOLN
INTRAVENOUS | Status: AC
  Filled 2023-12-24: qty 100

## 2023-12-24 MED ORDER — FENTANYL CITRATE (PF) 100 MCG/2ML IJ SOLN
INTRAMUSCULAR | Status: DC | PRN
  Administered 2023-12-24: 15 ug via INTRATHECAL

## 2023-12-24 MED ORDER — KETOROLAC TROMETHAMINE 30 MG/ML IJ SOLN
30.0000 mg | Freq: Four times a day (QID) | INTRAMUSCULAR | Status: AC
  Administered 2023-12-25 (×3): 30 mg via INTRAVENOUS
  Filled 2023-12-24 (×3): qty 1

## 2023-12-24 MED ORDER — CEFAZOLIN SODIUM-DEXTROSE 2-4 GM/100ML-% IV SOLN
INTRAVENOUS | Status: AC
  Filled 2023-12-24: qty 100

## 2023-12-24 MED ORDER — TRANEXAMIC ACID-NACL 1000-0.7 MG/100ML-% IV SOLN
INTRAVENOUS | Status: DC | PRN
  Administered 2023-12-24: 1000 mg via INTRAVENOUS

## 2023-12-24 MED ORDER — IBUPROFEN 600 MG PO TABS
600.0000 mg | ORAL_TABLET | Freq: Four times a day (QID) | ORAL | Status: DC
  Administered 2023-12-25 – 2023-12-26 (×3): 600 mg via ORAL
  Filled 2023-12-24 (×4): qty 1

## 2023-12-24 MED ORDER — DIPHENHYDRAMINE HCL 25 MG PO CAPS
25.0000 mg | ORAL_CAPSULE | ORAL | Status: DC | PRN
  Filled 2023-12-24 (×2): qty 1

## 2023-12-24 MED ORDER — SCOPOLAMINE 1 MG/3DAYS TD PT72
MEDICATED_PATCH | TRANSDERMAL | Status: AC
Start: 2023-12-24 — End: ?
  Filled 2023-12-24: qty 1

## 2023-12-24 MED ORDER — OXYTOCIN-SODIUM CHLORIDE 30-0.9 UT/500ML-% IV SOLN
2.5000 [IU]/h | INTRAVENOUS | Status: AC
  Administered 2023-12-24 – 2023-12-25 (×2): 2.5 [IU]/h via INTRAVENOUS
  Filled 2023-12-24: qty 500

## 2023-12-24 MED ORDER — MORPHINE SULFATE (PF) 0.5 MG/ML IJ SOLN
INTRAMUSCULAR | Status: DC | PRN
  Administered 2023-12-24: 150 ug via INTRATHECAL

## 2023-12-24 MED ORDER — OXYCODONE HCL 5 MG/5ML PO SOLN: 5.0000 mg | Freq: Once | ORAL | Status: DC | PRN

## 2023-12-24 MED ORDER — SIMETHICONE 80 MG PO CHEW
80.0000 mg | CHEWABLE_TABLET | Freq: Three times a day (TID) | ORAL | Status: DC
  Administered 2023-12-25 – 2023-12-26 (×5): 80 mg via ORAL
  Filled 2023-12-24 (×5): qty 1

## 2023-12-24 MED ORDER — STERILE WATER FOR IRRIGATION IR SOLN
Status: DC | PRN
  Administered 2023-12-24: 1

## 2023-12-24 MED ORDER — HYDROMORPHONE HCL 1 MG/ML IJ SOLN: 0.2500 mg | INTRAMUSCULAR | Status: DC | PRN

## 2023-12-24 MED ORDER — ONDANSETRON HCL 4 MG/2ML IJ SOLN: 4.0000 mg | Freq: Three times a day (TID) | INTRAMUSCULAR | Status: DC | PRN

## 2023-12-24 MED ORDER — DEXAMETHASONE SODIUM PHOSPHATE 10 MG/ML IJ SOLN
INTRAMUSCULAR | Status: DC | PRN
  Administered 2023-12-24: 10 mg via INTRAVENOUS

## 2023-12-24 MED ORDER — OXYCODONE HCL 5 MG PO TABS: 5.0000 mg | ORAL_TABLET | Freq: Once | ORAL | Status: DC | PRN

## 2023-12-24 MED ORDER — DIPHENHYDRAMINE HCL 25 MG PO CAPS
25.0000 mg | ORAL_CAPSULE | Freq: Four times a day (QID) | ORAL | Status: DC | PRN
Start: 2023-12-24 — End: 2023-12-26
  Administered 2023-12-25 (×2): 25 mg via ORAL

## 2023-12-24 MED ORDER — WITCH HAZEL-GLYCERIN EX PADS: 1.0000 | MEDICATED_PAD | CUTANEOUS | Status: DC | PRN

## 2023-12-24 MED ORDER — PHENYLEPHRINE 80 MCG/ML (10ML) SYRINGE FOR IV PUSH (FOR BLOOD PRESSURE SUPPORT)
PREFILLED_SYRINGE | INTRAVENOUS | Status: AC
  Filled 2023-12-24: qty 10

## 2023-12-24 MED ORDER — MORPHINE SULFATE (PF) 0.5 MG/ML IJ SOLN
INTRAMUSCULAR | Status: AC
  Filled 2023-12-24: qty 10

## 2023-12-24 MED ORDER — ONDANSETRON HCL 4 MG/2ML IJ SOLN
INTRAMUSCULAR | Status: AC
  Filled 2023-12-24: qty 2

## 2023-12-24 MED ORDER — LACTATED RINGERS IV SOLN
INTRAVENOUS | Status: DC | PRN
Start: 2023-12-24 — End: 2023-12-24

## 2023-12-24 MED ORDER — ONDANSETRON HCL 4 MG/2ML IJ SOLN: 4.0000 mg | Freq: Once | INTRAMUSCULAR | Status: DC | PRN

## 2023-12-24 MED ORDER — ZOLPIDEM TARTRATE 5 MG PO TABS: 5.0000 mg | ORAL_TABLET | Freq: Every evening | ORAL | Status: DC | PRN

## 2023-12-24 MED ORDER — FENTANYL CITRATE (PF) 100 MCG/2ML IJ SOLN
INTRAMUSCULAR | Status: AC
  Filled 2023-12-24: qty 2

## 2023-12-24 SURGICAL SUPPLY — 33 items
BENZOIN TINCTURE PRP APPL 2/3 (GAUZE/BANDAGES/DRESSINGS) IMPLANT
CHLORAPREP W/TINT 26 (MISCELLANEOUS) ×4 IMPLANT
CLAMP UMBILICAL CORD (MISCELLANEOUS) ×2 IMPLANT
CLOTH BEACON ORANGE TIMEOUT ST (SAFETY) ×2 IMPLANT
DERMABOND ADVANCED .7 DNX12 (GAUZE/BANDAGES/DRESSINGS) ×2 IMPLANT
DRSG OPSITE POSTOP 4X10 (GAUZE/BANDAGES/DRESSINGS) ×2 IMPLANT
ELECTRODE REM PT RTRN 9FT ADLT (ELECTROSURGICAL) ×2 IMPLANT
EXTRACTOR VACUUM BELL STYLE (SUCTIONS) IMPLANT
GAUZE SPONGE 4X4 12PLY STRL LF (GAUZE/BANDAGES/DRESSINGS) IMPLANT
GLOVE BIOGEL PI IND STRL 6.5 (GLOVE) ×2 IMPLANT
GLOVE BIOGEL PI IND STRL 7.0 (GLOVE) ×4 IMPLANT
GLOVE SURG SS PI 6.0 STRL IVOR (GLOVE) ×2 IMPLANT
GOWN STRL REUS W/TWL LRG LVL3 (GOWN DISPOSABLE) ×4 IMPLANT
KIT ABG SYR 3ML LUER SLIP (SYRINGE) IMPLANT
LIGASURE IMPACT 36 18CM CVD LR (INSTRUMENTS) IMPLANT
NDL HYPO 25X5/8 SAFETYGLIDE (NEEDLE) IMPLANT
NEEDLE HYPO 25X5/8 SAFETYGLIDE (NEEDLE) IMPLANT
NS IRRIG 1000ML POUR BTL (IV SOLUTION) ×2 IMPLANT
PACK C SECTION WH (CUSTOM PROCEDURE TRAY) ×2 IMPLANT
PAD ABD 8X10 STRL (GAUZE/BANDAGES/DRESSINGS) IMPLANT
PAD OB MATERNITY 4.3X12.25 (PERSONAL CARE ITEMS) ×2 IMPLANT
RTRCTR C-SECT PINK 25CM LRG (MISCELLANEOUS) IMPLANT
STRIP CLOSURE SKIN 1/2X4 (GAUZE/BANDAGES/DRESSINGS) IMPLANT
SUT PDS AB 0 CTX 36 PDP370T (SUTURE) IMPLANT
SUT PLAIN ABS 2-0 CT1 27XMFL (SUTURE) IMPLANT
SUT VIC AB 0 CT1 36 (SUTURE) ×4 IMPLANT
SUT VIC AB 0 CTX36XBRD ANBCTRL (SUTURE) ×4 IMPLANT
SUT VIC AB 2-0 CT1 TAPERPNT 27 (SUTURE) ×4 IMPLANT
SUT VIC AB 4-0 KS 27 (SUTURE) ×2 IMPLANT
SUT VICRYL+ 3-0 36IN CT-1 (SUTURE) ×2 IMPLANT
TOWEL OR 17X24 6PK STRL BLUE (TOWEL DISPOSABLE) ×2 IMPLANT
TRAY FOLEY W/BAG SLVR 14FR LF (SET/KITS/TRAYS/PACK) ×2 IMPLANT
WATER STERILE IRR 1000ML POUR (IV SOLUTION) ×2 IMPLANT

## 2023-12-24 NOTE — Op Note (Signed)
 C-Section Operative Note  Date: 12/24/23  Preoperative Diagnosis: [redacted] weeks gestation Gestational hypertension Previous cesarean section Encounter for sterilization Suspected placental cyst  Postoperative Diagnosis: Same as pre-op  Procedure: Repeat low transverse cesarean section with bilateral salpingectomy  Surgeon: Rossie Coon, DO Assist: M. Emma Hardy, MD    An experienced assistant was required given the standard of surgical care given the complexity of the case.  This assistant was needed for exposure, dissection, suctioning, retraction, instrument exchange, assisting with delivery with administration of fundal pressure, and for overall help during the procedure.     Operative Findings: Dense rectus fascia with defect of the right rectus abdominus at the lateral edge. Gravid uterus without evidence of adhesive disease. Clear amniotic fluid. Viable female infant in cephalic position weighing 2760g with APGARS of 8 and 9 at 1 and 5 minutes, respectively. Placenta without visible placenta cyst. Normal fallopian tubes and ovaries bilaterally. Specimens: Placenta and bilateral fallopian tubes to pathology EBL 125 IVF 1300 UOP 100  Patient Course: 33 yo G4P3 with history of previous c-section x 3, newly diagnosed with gestational hypertension presented for scheduled cesarean section and sterilization.  Consent:  R/B/A of cesarean section discussed with patient. Alternative would be vaginal delivery which would mean shorter postpartum stay and decreased risk of bleeding. Risks of cesarean section include but are not limited to infection of the uterus, pelvic organs, or skin, inadvertent injury to internal organs, such as bowel or bladder, vasculature, and nerves. If there is major injury, extensive surgery may be required. If injury is minor, it may be treated with relative ease and repaired at the time of injury. Discussed possibility of excessive blood loss and transfusion. If bleeding  cannot be controlled using medical or minor surgical methods, a cesarean hysterectomy may be performed which would mean no future fertility. Patient accepts the possibility of blood transfusion, if necessary. Patient understands and agrees to move forward with section.   Operative Procedure: Patient was taken to the operating room where combined spinal-epidural anesthesia was found to be adequate by Allis clamp test. She was prepped and draped in the normal sterile fashion in the dorsal supine position with a leftward tilt. An appropriate time out was performed. A Pfannenstiel skin incision was then made with the scalpel and carried through to the underlying layer of fascia by sharp dissection and Bovie cautery. The fascia was nicked in the midline and the incision was extended laterally with Mayo scissors. The superior aspect of the incision was grasped Kocher clamps and dissected off the underlying rectus muscles. In a similar fashion, the inferior aspect was dissected off the rectus muscles. Rectus muscles were separated in the midline and the peritoneal cavity entered sharply with Metzenbaum scissors. The peritoneal incision was then extended both superiorly and inferiorly with careful attention to avoid both bowel and bladder. The Alexis self-retaining wound retractor was then placed within the incision and the lower uterine segment exposed. The bladder flap was developed with Metzenbaum scissors and pushed away from the lower uterine segment. The lower uterine segment was then incised in a low transverse fashion and the cavity itself entered bluntly. The incision was extended bluntly. Amniotic sac was ruptured and fluid was noted to be clear in color. The infant's head was then lifted and delivered from the incision without difficulty using the standard movements. The remainder of the infant delivered and the nose and mouth bulb suctioned with the cord clamped and cut as well after 1 minute. The infant was  handed  off to the waiting pediatric team. The placenta was then spontaneously expressed from the uterus and the uterus cleared of all clots and debris with moist lap sponge. The uterine incision was then repaired with a  running locked layer of 0-vicryl.   Attention was then turned to the adnexa. The fallopian tubes were identified free of adhesions. The Ligasure device was used to ligate the mesosalpinx moving from lateral to medial. They were transected approximately 1 cm from the cornua and sent to pathology. Hemostasis was seen.  The gutters were then cleaned.   The uterine incision was inspected. A single figure-of eight suture with 0-vicryl established hemostasis at the midline. All instruments and sponges as well as the Alexis retractor were then removed from the abdomen. The rectus muscles were then reapproximated with an interrupted mattress sutures of 2-0 Vicryl. The fascia was then closed with 0 Vicryl in a running fashion. The skin was closed with a subcuticular stitch of 4-0 Vicryl on a Keith needle and then reinforced with dermabond and a Honeycomb dressing. At the conclusion of the procedure all instruments and sponge counts were correct. Patient was taken to the recovery room in good condition with her baby accompanying her skin to skin.   Jeneane Miracle

## 2023-12-24 NOTE — H&P (Signed)
 Paula Patton is a 33 y.o. female presenting for cesarean delivery.   +FM. Denies LOF, VB, or regular contractions.   Pregnancy is complicated by:  - Previous cesarean section x 3 - Gestational hypertension: criteria met today in office. PreE labs wnl 6/3.  - Suspected placental cyst: noted on initial OB scan. Followed with MFM. Not expected to impact delivery  - History of PPD: following G1  OB History     Gravida  4   Para  3   Term  3   Preterm      AB      Living  3      SAB      IAB      Ectopic      Multiple      Live Births  3          Past Medical History:  Diagnosis Date   Medical history non-contributory    Past Surgical History:  Procedure Laterality Date   CESAREAN SECTION     Family History: family history is not on file. Social History:  reports that she has quit smoking. Her smoking use included cigarettes. She has never used smokeless tobacco. She reports that she does not drink alcohol and does not use drugs.     Maternal Diabetes: No Genetic Screening: Declined Maternal Ultrasounds/Referrals: Other:Placental cyst Fetal Ultrasounds or other Referrals:  Referred to Materal Fetal Medicine  Maternal Substance Abuse:  No Significant Maternal Medications:  None Significant Maternal Lab Results:  Group B Strep negative Number of Prenatal Visits:greater than 3 verified prenatal visits Maternal Vaccinations:TDap Other Comments:  None  Review of Systems  Constitutional:  Negative for chills and fever.  Eyes:  Negative for visual disturbance.  Respiratory:  Negative for chest tightness and shortness of breath.   Cardiovascular:  Negative for chest pain.  Gastrointestinal:  Positive for vomiting. Negative for abdominal pain.  Genitourinary:  Negative for pelvic pain, vaginal bleeding and vaginal discharge.  Musculoskeletal:  Negative for gait problem.  Neurological:  Positive for headaches.   History   Last menstrual period  04/05/2023. Exam Physical Exam Constitutional:      General: She is not in acute distress.    Appearance: Normal appearance.  HENT:     Head: Normocephalic and atraumatic.  Pulmonary:     Effort: Pulmonary effort is normal.  Musculoskeletal:        General: Normal range of motion.  Skin:    General: Skin is warm and dry.  Neurological:     Mental Status: She is alert.  Psychiatric:        Mood and Affect: Mood normal.        Behavior: Behavior normal.     Prenatal labs: ABO, Rh:  B positive Antibody: Negative (11/20 0000) Rubella: Immune (11/20 0000) RPR: Nonreactive (11/20 0000)  HBsAg: Negative (11/20 0000)  HIV: Non-reactive (11/20 0000)  GBS:   Negative  Assessment/Plan: 33 yo G4P3003 @ 37.6 presents for RCS in the setting of newly diagnosed GHTN - GHTN: preeclampsia labs ordered - Desires sterilization. She understands that this is intended to be a permanent procedure and understands that future fertility would require ART.  - Ancef  - Reviewed risks of c-section. Risks include but not limited to bleeding, infection, injury to bowel, bladder, vasculature, and nerves. Reviewed risk of need for blood transfusion or hysterectomy in the case of refractory bleeding. Expresses understanding of all risks.   [name] 12/24/2023, 12:22 PM

## 2023-12-24 NOTE — Anesthesia Postprocedure Evaluation (Signed)
 Anesthesia Post Note  Patient: Paula Patton  Procedure(s) Performed: CESAREAN DELIVERY LIGATION, FALLOPIAN TUBE, BILATERAL (Bilateral)     Patient location during evaluation: PACU Anesthesia Type: Spinal Level of consciousness: awake, awake and alert and oriented Pain management: pain level controlled Vital Signs Assessment: post-procedure vital signs reviewed and stable Respiratory status: spontaneous breathing, nonlabored ventilation and respiratory function stable Cardiovascular status: stable Postop Assessment: no headache, no backache, epidural receding, patient able to bend at knees and no signs of nausea or vomiting Anesthetic complications: no   No notable events documented.  Last Vitals:  Vitals:   12/24/23 2000 12/24/23 2015  BP: (!) 149/97 (!) 147/88  Pulse: 75 66  Resp: (!) 21 16  Temp:  36.7 C  SpO2: 99% 99%    Last Pain:  Vitals:   12/24/23 2015  TempSrc: Axillary  PainSc: 0-No pain   Pain Goal:    LLE Motor Response: Purposeful movement (12/24/23 2015) LLE Sensation: Tingling (12/24/23 2015) RLE Motor Response: Purposeful movement (12/24/23 2015) RLE Sensation: Tingling (12/24/23 2015)     Epidural/Spinal Function Cutaneous sensation: Tingles (12/24/23 2015), Patient able to flex knees: Yes (12/24/23 2015), Patient able to lift hips off bed: Yes (12/24/23 2015), Back pain beyond tenderness at insertion site: No (12/24/23 2015), Progressively worsening motor and/or sensory loss: No (12/24/23 2015), Bowel and/or bladder incontinence post epidural: No (12/24/23 2015)  Erin Havers

## 2023-12-24 NOTE — Anesthesia Procedure Notes (Signed)
 Epidural Patient location during procedure: OB Start time: 12/24/2023 5:42 PM End time: 12/24/2023 5:45 PM  Staffing Anesthesiologist: Jacquelyne Matte, DO Performed: anesthesiologist   Preanesthetic Checklist Completed: patient identified, IV checked, risks and benefits discussed, monitors and equipment checked, pre-op evaluation and timeout performed  Epidural Patient position: sitting Prep: DuraPrep and site prepped and draped Patient monitoring: continuous pulse ox, blood pressure, heart rate and cardiac monitor Approach: midline Location: L3-L4 Injection technique: LOR air  Needle:  Needle type: Tuohy  Needle gauge: 17 G Needle length: 9 cm Needle insertion depth: 6 cm Catheter type: closed end flexible Catheter size: 19 Gauge Catheter at skin depth: 11 cm Test dose: negative  Assessment Sensory level: T8 Events: blood not aspirated, no cerebrospinal fluid, injection not painful, no injection resistance, no paresthesia and negative IV test  Additional Notes Patient identified. Risks/Benefits/Options discussed with patient including but not limited to bleeding, infection, nerve damage, paralysis, failed block, incomplete pain control, headache, blood pressure changes, nausea, vomiting, reactions to medication both or allergic, itching and postpartum back pain. Confirmed with bedside nurse the patient's most recent platelet count. Confirmed with patient that they are not currently taking any anticoagulation, have any bleeding history or any family history of bleeding disorders. Patient expressed understanding and wished to proceed. All questions were answered. Sterile technique was used throughout the entire procedure. Please see nursing notes for vital signs. Test dose was given through epidural catheter and negative prior to continuing to dose epidural or start infusion. Warning signs of high block given to the patient including shortness of breath, tingling/numbness in  hands, complete motor block, or any concerning symptoms with instructions to call for help. Patient was given instructions on fall risk and not to get out of bed. All questions and concerns addressed with instructions to call with any issues or inadequate analgesia.     CSE with 24G pencan through tuohy, clear CSF no issues.Reason for block:procedure for pain

## 2023-12-24 NOTE — Interval H&P Note (Signed)
 History and Physical Interval Note:  12/24/2023 5:25 PM  Paula Patton  has presented today for surgery, with the diagnosis of repeat CESAREAN SECTION.  The various methods of treatment have been discussed with the patient and family. After consideration of risks, benefits and other options for treatment, the patient has consented to  Procedure(s): CESAREAN DELIVERY (N/A) as a surgical intervention.  The patient's history has been reviewed, patient examined, no change in status, stable for surgery.  I have reviewed the patient's chart and labs.  Questions were answered to the patient's satisfaction.     [name]

## 2023-12-24 NOTE — Transfer of Care (Signed)
 Immediate Anesthesia Transfer of Care Note  Patient: Paula Patton  Procedure(s) Performed: CESAREAN DELIVERY LIGATION, FALLOPIAN TUBE, BILATERAL (Bilateral)  Patient Location: PACU  Anesthesia Type:Epidural  Level of Consciousness: awake, alert , oriented, and patient cooperative  Airway & Oxygen Therapy: Patient Spontanous Breathing  Post-op Assessment: Report given to RN and Post -op Vital signs reviewed and stable  Post vital signs: Reviewed and stable  Last Vitals:  Vitals Value Taken Time  BP 134/85 12/24/23 1907  Temp 36.5 C 12/24/23 1905  Pulse 78 12/24/23 1911  Resp 18 12/24/23 1911  SpO2 100 % 12/24/23 1911  Vitals shown include unfiled device data.  Last Pain:  Vitals:   12/24/23 1905  TempSrc: Oral         Complications: No notable events documented.

## 2023-12-24 NOTE — Anesthesia Preprocedure Evaluation (Addendum)
 Anesthesia Evaluation  Patient identified by MRN, date of birth, ID band Patient awake    Reviewed: Allergy & Precautions, NPO status , Patient's Chart, lab work & pertinent test results  Airway Mallampati: II  TM Distance: >3 FB Neck ROM: Full    Dental  (+) Teeth Intact, Dental Advisory Given   Pulmonary former smoker   Pulmonary exam normal breath sounds clear to auscultation       Cardiovascular negative cardio ROS Normal cardiovascular exam Rhythm:Regular Rate:Normal     Neuro/Psych negative neurological ROS  negative psych ROS   GI/Hepatic negative GI ROS, Neg liver ROS,,,  Endo/Other  BMI 32  Renal/GU negative Renal ROS  negative genitourinary   Musculoskeletal negative musculoskeletal ROS (+)    Abdominal  (+) + obese  Peds negative pediatric ROS (+)  Hematology  (+) Blood dyscrasia, anemia Hb 11.5, plt 321   Anesthesia Other Findings   Reproductive/Obstetrics (+) Pregnancy 3 prior sections Desires sterility                             Anesthesia Physical Anesthesia Plan  ASA: 3  Anesthesia Plan: Spinal   Post-op Pain Management: Regional block, Toradol IV (intra-op)* and Ofirmev  IV (intra-op)*   Induction:   PONV Risk Score and Plan: 3 and Ondansetron , Dexamethasone and Treatment may vary due to age or medical condition  Airway Management Planned: Natural Airway and Nasal Cannula  Additional Equipment: None  Intra-op Plan:   Post-operative Plan:   Informed Consent: I have reviewed the patients History and Physical, chart, labs and discussed the procedure including the risks, benefits and alternatives for the proposed anesthesia with the patient or authorized representative who has indicated his/her understanding and acceptance.       Plan Discussed with: CRNA  Anesthesia Plan Comments:        Anesthesia Quick Evaluation

## 2023-12-25 LAB — CBC
HCT: 32.7 % — ABNORMAL LOW (ref 36.0–46.0)
Hemoglobin: 10.7 g/dL — ABNORMAL LOW (ref 12.0–15.0)
MCH: 28.1 pg (ref 26.0–34.0)
MCHC: 32.7 g/dL (ref 30.0–36.0)
MCV: 85.8 fL (ref 80.0–100.0)
Platelets: 268 10*3/uL (ref 150–400)
RBC: 3.81 MIL/uL — ABNORMAL LOW (ref 3.87–5.11)
RDW: 13.3 % (ref 11.5–15.5)
WBC: 18.7 10*3/uL — ABNORMAL HIGH (ref 4.0–10.5)
nRBC: 0 % (ref 0.0–0.2)

## 2023-12-25 LAB — COMPREHENSIVE METABOLIC PANEL WITH GFR
ALT: 11 U/L (ref 0–44)
AST: 23 U/L (ref 15–41)
Albumin: 2.4 g/dL — ABNORMAL LOW (ref 3.5–5.0)
Alkaline Phosphatase: 105 U/L (ref 38–126)
Anion gap: 11 (ref 5–15)
BUN: <5 mg/dL — ABNORMAL LOW (ref 6–20)
CO2: 21 mmol/L — ABNORMAL LOW (ref 22–32)
Calcium: 9.3 mg/dL (ref 8.9–10.3)
Chloride: 103 mmol/L (ref 98–111)
Creatinine, Ser: 0.69 mg/dL (ref 0.44–1.00)
GFR, Estimated: >60 mL/min (ref >60)
Glucose, Bld: 97 mg/dL (ref 70–99)
Potassium: 4.7 mmol/L (ref 3.5–5.1)
Sodium: 135 mmol/L (ref 135–145)
Total Bilirubin: 0.3 mg/dL (ref 0.0–1.2)
Total Protein: 5.6 g/dL — ABNORMAL LOW (ref 6.5–8.1)

## 2023-12-25 LAB — RPR: RPR Ser Ql: NONREACTIVE

## 2023-12-25 MED ORDER — FAMOTIDINE 20 MG PO TABS
20.0000 mg | ORAL_TABLET | Freq: Two times a day (BID) | ORAL | Status: DC | PRN
  Administered 2023-12-25: 20 mg via ORAL
  Filled 2023-12-25: qty 1

## 2023-12-25 NOTE — Final Progress Note (Addendum)
 Writer got an order for Pepcid   20 mg prn twice daily.per patients request to heart burn

## 2023-12-25 NOTE — Progress Notes (Signed)
 Subjective: Postpartum Day 1: Cesarean Delivery Patient reports tolerating PO and no problems voiding. Incisional pain is currently 6/10, but just took a dose of pain medicine. She has been out of bed since surgery, to the bathroom. Lochia minimal.   Objective: Vital signs in last 24 hours: Temp:  [97.7 F (36.5 C)-99 F (37.2 C)] 98.2 F (36.8 C) (06/06 0737) Pulse Rate:  [63-83] 63 (06/06 0737) Resp:  [14-21] 16 (06/06 0737) BP: (120-154)/(69-106) 124/69 (06/06 0737) SpO2:  [88 %-100 %] 98 % (06/06 0737) Weight:  [73.9 kg] 73.9 kg (06/05 1715)  Physical Exam:  General: alert, cooperative, and no distress Lochia: appropriate Abdomen: soft, non-tender Uterine Fundus: firm Incision: appropriately tender to palpation, covered by Honeycomb with no strikethrough DVT Evaluation: No evidence of DVT seen on physical exam.  Recent Labs    12/24/23 1600 12/25/23 0615  HGB 11.5* 10.7*  HCT 35.4* 32.7*    Assessment/Plan: Paula Patton is a 33 yo G4 now P4004 s/p rLTCS and BS @ 37.6 for newly diagnosed GHTN -POD1 Status post Cesarean section. Doing well postoperatively. Continue current care. - GHTN: Normotensive since just after delivery. No meds. PIH labs wnl. Asx of PIH. -h/o PPD following G1 pregnancy: consider mood check at postpartum visit.  Dispo: Care as above. Anticipate discharge POD2-3.  Alene Ana, MD 12/25/2023, 11:07 AM

## 2023-12-25 NOTE — Progress Notes (Addendum)
 CSW received a consult for PPD, Per chart review MOB experienced PPD with her first pregnancy (2010) and has had 3 pregnancies since then. At this time CSW involvement would not be warranted. Please reach out to the CSW if any additional needs arise.   CSW identifies no further need for intervention and no barriers to discharge at this time.  Jenney Modest, Milinda Allen Clinical Social Worker [phone]

## 2023-12-26 ENCOUNTER — Encounter (HOSPITAL_COMMUNITY): Payer: Self-pay | Admitting: Student

## 2023-12-26 MED ORDER — OXYCODONE HCL 5 MG PO TABS: 5.0000 mg | ORAL_TABLET | ORAL | 0 refills | Status: AC | PRN

## 2023-12-26 MED ORDER — IBUPROFEN 800 MG PO TABS: 800.0000 mg | ORAL_TABLET | Freq: Three times a day (TID) | ORAL | 0 refills | Status: AC | PRN

## 2023-12-26 NOTE — Plan of Care (Signed)
   Problem: Clinical Measurements: Goal: Ability to maintain clinical measurements within normal limits will improve Outcome: Completed/Met Goal: Will remain free from infection Outcome: Completed/Met Goal: Diagnostic test results will improve Outcome: Completed/Met Goal: Respiratory complications will improve Outcome: Completed/Met Goal: Cardiovascular complication will be avoided Outcome: Completed/Met

## 2023-12-26 NOTE — Discharge Summary (Signed)
 Postpartum Discharge Summary       Patient Name: Paula Patton DOB: [birth year] MRN: [id number]  Date of admission: 12/24/2023 Delivery date:12/24/2023 Delivering provider: Jeneane Miracle A Date of discharge: 12/26/2023  Admitting diagnosis: Pregnancy [Z34.90] Uterine scar from previous surgery affecting pregnancy [O34.29] Intrauterine pregnancy: [redacted]w[redacted]d     Secondary diagnosis:  Principal Problem:   Pregnancy Active Problems:   Uterine scar from previous surgery affecting pregnancy    Discharge diagnosis: Term Pregnancy Delivered and Gestational Hypertension                                              Post partum procedures:NA Augmentation: N/A Complications: None  Hospital course: Sceduled C/S   33 y.o. yo G4P4004 at 109w4d was admitted to the hospital 12/24/2023 for scheduled cesarean section with the following indication:Elective Repeat, Prior Uterine Surgery, and Gestational hypertension.Delivery details are as follows:  Membrane Rupture Time/Date: 6:11 PM,12/24/2023  Delivery Method:C-Section, Low Transverse Operative Delivery:N/A Details of operation can be found in separate operative note.  Patient had a postpartum course complicated by nothing.  She is ambulating, tolerating a regular diet, passing flatus, and urinating well. Patient is discharged home in stable condition on  12/26/23        Newborn Data: Birth date:12/24/2023 Birth time:6:12 PM Gender:Female Living status:Living Apgars:8 ,9  Weight:2760 g    Magnesium Sulfate received: No BMZ received: No Rhophylac:N/A Immunizations administered:  There is no immunization history on file for this patient.  Physical exam  Vitals:   12/25/23 1411 12/25/23 2025 12/26/23 0612 12/26/23 1213  BP: 129/80 124/75 119/81 119/77  Pulse: (!) 58 77 72 79  Resp: 16 18 18 16   Temp: 97.7 F (36.5 C) 98 F (36.7 C) 97.7 F (36.5 C) 97.8 F (36.6 C)  TempSrc: Oral Oral Oral Oral  SpO2: 98% 97% 99% 98%  Weight:      Height:        General: alert, cooperative, and no distress Lochia: appropriate Uterine Fundus: firm Incision: Healing well with no significant drainage DVT Evaluation: No evidence of DVT seen on physical exam. Labs: Lab Results  Component Value Date   WBC 18.7 (H) 12/25/2023   HGB 10.7 (L) 12/25/2023   HCT 32.7 (L) 12/25/2023   MCV 85.8 12/25/2023   PLT 268 12/25/2023      Latest Ref Rng & Units 12/25/2023    6:15 AM  CMP  Glucose 70 - 99 mg/dL 97   BUN 6 - 20 mg/dL <5   Creatinine 4.09 - 1.00 mg/dL 8.11   Sodium 914 - 782 mmol/L 135   Potassium 3.5 - 5.1 mmol/L 4.7   Chloride 98 - 111 mmol/L 103   CO2 22 - 32 mmol/L 21   Calcium  8.9 - 10.3 mg/dL 9.3   Total Protein 6.5 - 8.1 g/dL 5.6   Total Bilirubin 0.0 - 1.2 mg/dL 0.3   Alkaline Phos 38 - 126 U/L 105   AST 15 - 41 U/L 23   ALT 0 - 44 U/L 11    Edinburgh Score:    12/25/2023   10:31 AM  Edinburgh Postnatal Depression Scale Screening Tool  I have been able to laugh and see the funny side of things. 0  I have looked forward with enjoyment to things. 0  I have blamed myself unnecessarily when things went wrong. 1  I have been anxious or worried for no good reason. 0  I have felt scared or panicky for no good reason. 0  Things have been getting on top of me. 0  I have been so unhappy that I have had difficulty sleeping. 0  I have felt sad or miserable. 0  I have been so unhappy that I have been crying. 0  The thought of harming myself has occurred to me. 0  Edinburgh Postnatal Depression Scale Total 1      After visit meds:  Allergies as of 12/26/2023   No Known Allergies      Medication List     STOP taking these medications    acetaminophen  650 MG CR tablet Commonly known as: TYLENOL    cyclobenzaprine  10 MG tablet Commonly known as: FLEXERIL    famotidine  20 MG tablet Commonly known as: Pepcid    ondansetron  4 MG tablet Commonly known as: Zofran        TAKE these medications    ibuprofen  800 MG  tablet Commonly known as: ADVIL  Take 1 tablet (800 mg total) by mouth every 8 (eight) hours as needed.   multivitamin-prenatal 27-0.8 MG Tabs tablet Take 1 tablet by mouth daily at 12 noon.   oxyCODONE  5 MG immediate release tablet Commonly known as: Roxicodone  Take 1 tablet (5 mg total) by mouth every 4 (four) hours as needed.               Discharge Care Instructions  (From admission, onward)           Start     Ordered   12/26/23 0000  Discharge wound care:       Comments: For a cesarean delivery: You may wash incision with soap and water .  Do not soak or submerge the incision for 2 weeks. Keep incision dry. You may need to keep a sanitary pad or panty liner between the incision and your clothing for comfort and to keep the incision dry. If you note drainage, increased pain, or increased redness of the incision, then please notify your physician.   12/26/23 1314   12/26/23 0000  If the dressing is still on your incision site when you go home, remove it on the third day after your surgery date. Remove dressing if it begins to fall off, or if it is dirty or damaged before the third day.       Comments: For a cesarean delivery   12/26/23 1314             Discharge home in stable condition Infant Feeding: Bottle Infant Disposition:home with mother Discharge instruction: per After Visit Summary and Postpartum booklet. Activity: Advance as tolerated. Pelvic rest for 6 weeks.  Diet: routine diet Anticipated Birth Control: Unsure Postpartum Appointment:4 weeks Future Appointments:No future appointments. Follow up Visit:      12/26/2023 Reggy Capers, MD

## 2023-12-28 LAB — SURGICAL PATHOLOGY

## 2023-12-30 ENCOUNTER — Encounter (HOSPITAL_COMMUNITY)
Admission: RE | Admit: 2023-12-30 | Discharge: 2023-12-30 | Disposition: A | Discharge status: Home / Self Care | Source: Ambulatory Visit | Attending: Student | Admitting: Student

## 2023-12-31 DIAGNOSIS — Z419 Encounter for procedure for purposes other than remedying health state, unspecified: Secondary | ICD-10-CM | POA: Diagnosis not present

## 2024-01-01 ENCOUNTER — Encounter (HOSPITAL_COMMUNITY): Payer: Self-pay | Admitting: Student

## 2024-01-01 ENCOUNTER — Observation Stay (HOSPITAL_COMMUNITY)

## 2024-01-01 ENCOUNTER — Observation Stay (HOSPITAL_COMMUNITY)
Admission: AD | Admit: 2024-01-01 | Discharge: 2024-01-03 | Disposition: A | Discharge status: Home / Self Care | Attending: Student | Admitting: Student

## 2024-01-01 ENCOUNTER — Other Ambulatory Visit: Payer: Self-pay

## 2024-01-01 DIAGNOSIS — O1415 Severe pre-eclampsia, complicating the puerperium: Secondary | ICD-10-CM | POA: Diagnosis present

## 2024-01-01 DIAGNOSIS — Z87891 Personal history of nicotine dependence: Secondary | ICD-10-CM | POA: Insufficient documentation

## 2024-01-01 DIAGNOSIS — O99513 Diseases of the respiratory system complicating pregnancy, third trimester: Secondary | ICD-10-CM | POA: Diagnosis not present

## 2024-01-01 DIAGNOSIS — O165 Unspecified maternal hypertension, complicating the puerperium: Principal | ICD-10-CM

## 2024-01-01 DIAGNOSIS — R0602 Shortness of breath: Secondary | ICD-10-CM | POA: Diagnosis not present

## 2024-01-01 DIAGNOSIS — Z3A37 37 weeks gestation of pregnancy: Secondary | ICD-10-CM | POA: Insufficient documentation

## 2024-01-01 DIAGNOSIS — O34219 Maternal care for unspecified type scar from previous cesarean delivery: Secondary | ICD-10-CM

## 2024-01-01 DIAGNOSIS — O1495 Unspecified pre-eclampsia, complicating the puerperium: Principal | ICD-10-CM | POA: Diagnosis present

## 2024-01-01 LAB — CBC WITH DIFFERENTIAL/PLATELET
Abs Immature Granulocytes: 0.11 10*3/uL — ABNORMAL HIGH (ref 0.00–0.07)
Basophils Absolute: 0.1 10*3/uL (ref 0.0–0.1)
Basophils Relative: 0 %
Eosinophils Absolute: 0.6 10*3/uL — ABNORMAL HIGH (ref 0.0–0.5)
Eosinophils Relative: 4 %
HCT: 33.4 % — ABNORMAL LOW (ref 36.0–46.0)
Hemoglobin: 10.6 g/dL — ABNORMAL LOW (ref 12.0–15.0)
Immature Granulocytes: 1 %
Lymphocytes Relative: 22 %
Lymphs Abs: 3.5 10*3/uL (ref 0.7–4.0)
MCH: 27.7 pg (ref 26.0–34.0)
MCHC: 31.7 g/dL (ref 30.0–36.0)
MCV: 87.2 fL (ref 80.0–100.0)
Monocytes Absolute: 0.8 10*3/uL (ref 0.1–1.0)
Monocytes Relative: 5 %
Neutro Abs: 11.1 10*3/uL — ABNORMAL HIGH (ref 1.7–7.7)
Neutrophils Relative %: 68 %
Platelets: 353 10*3/uL (ref 150–400)
RBC: 3.83 MIL/uL — ABNORMAL LOW (ref 3.87–5.11)
RDW: 13.2 % (ref 11.5–15.5)
WBC: 16.1 10*3/uL — ABNORMAL HIGH (ref 4.0–10.5)
nRBC: 0 % (ref 0.0–0.2)

## 2024-01-01 LAB — COMPREHENSIVE METABOLIC PANEL WITH GFR
ALT: 34 U/L (ref 0–44)
AST: 44 U/L — ABNORMAL HIGH (ref 15–41)
Albumin: 2.7 g/dL — ABNORMAL LOW (ref 3.5–5.0)
Alkaline Phosphatase: 84 U/L (ref 38–126)
Anion gap: 9 (ref 5–15)
BUN: 8 mg/dL (ref 6–20)
CO2: 24 mmol/L (ref 22–32)
Calcium: 8.8 mg/dL — ABNORMAL LOW (ref 8.9–10.3)
Chloride: 105 mmol/L (ref 98–111)
Creatinine, Ser: 0.68 mg/dL (ref 0.44–1.00)
GFR, Estimated: >60 mL/min (ref >60)
Glucose, Bld: 88 mg/dL (ref 70–99)
Potassium: 3.9 mmol/L (ref 3.5–5.1)
Sodium: 138 mmol/L (ref 135–145)
Total Bilirubin: 0.3 mg/dL (ref 0.0–1.2)
Total Protein: 6.1 g/dL — ABNORMAL LOW (ref 6.5–8.1)

## 2024-01-01 MED ORDER — LABETALOL HCL 5 MG/ML IV SOLN: 40.0000 mg | INTRAVENOUS | Status: DC | PRN

## 2024-01-01 MED ORDER — IBUPROFEN 800 MG PO TABS
800.0000 mg | ORAL_TABLET | Freq: Three times a day (TID) | ORAL | Status: DC | PRN
  Administered 2024-01-01 – 2024-01-02 (×3): 800 mg via ORAL
  Filled 2024-01-01 (×3): qty 1

## 2024-01-01 MED ORDER — NIFEDIPINE ER OSMOTIC RELEASE 60 MG PO TB24
60.0000 mg | ORAL_TABLET | ORAL | Status: DC
  Administered 2024-01-01: 60 mg via ORAL
  Filled 2024-01-01 (×2): qty 1

## 2024-01-01 MED ORDER — LABETALOL HCL 5 MG/ML IV SOLN: 20.0000 mg | INTRAVENOUS | Status: DC | PRN

## 2024-01-01 MED ORDER — NIFEDIPINE ER OSMOTIC RELEASE 30 MG PO TB24: 30.0000 mg | ORAL_TABLET | Freq: Two times a day (BID) | ORAL | Status: DC

## 2024-01-01 MED ORDER — LABETALOL HCL 5 MG/ML IV SOLN: 80.0000 mg | INTRAVENOUS | Status: DC | PRN

## 2024-01-01 MED ORDER — HYDRALAZINE HCL 20 MG/ML IJ SOLN: 10.0000 mg | INTRAMUSCULAR | Status: DC | PRN

## 2024-01-01 MED ORDER — OXYCODONE HCL 5 MG PO TABS
5.0000 mg | ORAL_TABLET | Freq: Four times a day (QID) | ORAL | Status: DC | PRN
  Administered 2024-01-01: 5 mg via ORAL
  Filled 2024-01-01: qty 1

## 2024-01-01 NOTE — H&P (Signed)
 Paula Patton is a 33 y.o. female PPD#8 presenting for admission in the setting of postpartum preeclampsia.   She was diagnosed with gestational hypertension at 37 weeks and subsequently delivered via repeat c-section. She was normotensive after delivery without medication. BP was again elevated at her blood pressure check yesterday and she was started on Procardia 30 XL, first dose 7 pm 6/12. Labs obtained at that time also showed new-onset transaminitis.   Reports shortness of breath. Denies chest pain, visual changes, RUQ pain. Lochia absent x 3 days. She is bottlefeeding her newborn.  OB History     Gravida  4   Para  4   Term  4   Preterm      AB      Living  4      SAB      IAB      Ectopic      Multiple  0   Live Births  4          Past Medical History:  Diagnosis Date   Medical history non-contributory    Past Surgical History:  Procedure Laterality Date   CESAREAN SECTION     CESAREAN SECTION N/A 12/24/2023   Procedure: CESAREAN DELIVERY;  Surgeon: [name], DO;  Location: [facility];  Service: Obstetrics;  Laterality: N/A;   TUBAL LIGATION Bilateral 12/24/2023   Procedure: LIGATION, FALLOPIAN TUBE, BILATERAL;  Surgeon: [name], DO;  Location: [facility];  Service: Obstetrics;  Laterality: Bilateral;   Family History: family history is not on file. Social History:  reports that she has quit smoking. Her smoking use included cigarettes. She has never used smokeless tobacco. She reports that she does not drink alcohol and does not use drugs.  Review of Systems  Eyes:  Negative for visual disturbance.  Respiratory:  Positive for shortness of breath. Negative for chest tightness.   Cardiovascular:  Negative for chest pain.  Gastrointestinal:  Negative for abdominal pain.  Genitourinary:  Negative for pelvic pain and vaginal bleeding.  Neurological:  Negative for headaches.   History   Vitals:   01/01/24 1734 01/01/24 1750 01/01/24  1812  BP: (!) 161/96 (!) 145/95   Pulse: 98 94   Resp: (!) 21    Temp: 98.3 F (36.8 C)    TempSrc: Oral    SpO2: 99%    Height:   5' (1.524 m)    Exam Physical Exam Constitutional:      General: She is not in acute distress.    Appearance: Normal appearance.  HENT:     Head: Normocephalic and atraumatic.  Pulmonary:     Effort: Pulmonary effort is normal. No respiratory distress.   Musculoskeletal:        General: Normal range of motion.     Right lower leg: No edema.     Left lower leg: No edema.   Skin:    General: Skin is warm and dry.   Neurological:     Mental Status: She is alert.   Psychiatric:        Mood and Affect: Mood normal.        Behavior: Behavior normal.     Assessment/Plan: 33 yo G4P4 PPD#8 with postpartum preeclampsia without severe features - Admit to OB speciality care - PreE: Severe range BP on arrival, mild range on repeat 15 minutes later. Collect 24 hour urine (will collect with clean catch given absent lochia). CXR to rule out pulmonary edema. Due  for Procardia dose, will increase to 60 XL. CBC/CMP. Close BP monitoring-will start magnesium and and treat with IV labetalol for severe range BP - Transaminitis: Repeat CMP ordered, avoid hepatotoxic meds  - Ibuprofen /oxycodone  PRN postop pain  [name] 01/01/2024, 6:33 PM

## 2024-01-02 ENCOUNTER — Encounter (HOSPITAL_COMMUNITY): Payer: Self-pay | Admitting: Student

## 2024-01-02 DIAGNOSIS — O1415 Severe pre-eclampsia, complicating the puerperium: Secondary | ICD-10-CM | POA: Diagnosis not present

## 2024-01-02 LAB — COMPREHENSIVE METABOLIC PANEL WITH GFR
ALT: 32 U/L (ref 0–44)
AST: 30 U/L (ref 15–41)
Albumin: 3 g/dL — ABNORMAL LOW (ref 3.5–5.0)
Alkaline Phosphatase: 73 U/L (ref 38–126)
Anion gap: 10 (ref 5–15)
BUN: 5 mg/dL — ABNORMAL LOW (ref 6–20)
CO2: 23 mmol/L (ref 22–32)
Calcium: 9.1 mg/dL (ref 8.9–10.3)
Chloride: 104 mmol/L (ref 98–111)
Creatinine, Ser: 0.56 mg/dL (ref 0.44–1.00)
GFR, Estimated: >60 mL/min (ref >60)
Glucose, Bld: 92 mg/dL (ref 70–99)
Potassium: 3.8 mmol/L (ref 3.5–5.1)
Sodium: 137 mmol/L (ref 135–145)
Total Bilirubin: 0.7 mg/dL (ref 0.0–1.2)
Total Protein: 6.9 g/dL (ref 6.5–8.1)

## 2024-01-02 LAB — CBC WITH DIFFERENTIAL/PLATELET
Abs Immature Granulocytes: 0.17 10*3/uL — ABNORMAL HIGH (ref 0.00–0.07)
Basophils Absolute: 0.1 10*3/uL (ref 0.0–0.1)
Basophils Relative: 1 %
Eosinophils Absolute: 0.6 10*3/uL — ABNORMAL HIGH (ref 0.0–0.5)
Eosinophils Relative: 4 %
HCT: 39.2 % (ref 36.0–46.0)
Hemoglobin: 12.6 g/dL (ref 12.0–15.0)
Immature Granulocytes: 1 %
Lymphocytes Relative: 18 %
Lymphs Abs: 2.9 10*3/uL (ref 0.7–4.0)
MCH: 27.8 pg (ref 26.0–34.0)
MCHC: 32.1 g/dL (ref 30.0–36.0)
MCV: 86.3 fL (ref 80.0–100.0)
Monocytes Absolute: 0.7 10*3/uL (ref 0.1–1.0)
Monocytes Relative: 4 %
Neutro Abs: 11.7 10*3/uL — ABNORMAL HIGH (ref 1.7–7.7)
Neutrophils Relative %: 72 %
Platelets: 422 10*3/uL — ABNORMAL HIGH (ref 150–400)
RBC: 4.54 MIL/uL (ref 3.87–5.11)
RDW: 13.2 % (ref 11.5–15.5)
WBC: 16.1 10*3/uL — ABNORMAL HIGH (ref 4.0–10.5)
nRBC: 0 % (ref 0.0–0.2)

## 2024-01-02 LAB — PROTEIN, URINE, 24 HOUR
Collection Interval-UPROT: 24 h
Protein, 24H Urine: 184 mg/d — ABNORMAL HIGH (ref 50–100)
Protein, Urine: 8 mg/dL
Urine Total Volume-UPROT: 2300 mL

## 2024-01-02 MED ORDER — CYCLOBENZAPRINE HCL 10 MG PO TABS
5.0000 mg | ORAL_TABLET | Freq: Every day | ORAL | Status: DC
  Administered 2024-01-02: 5 mg via ORAL
  Filled 2024-01-02: qty 1

## 2024-01-02 MED ORDER — ZOLPIDEM TARTRATE 5 MG PO TABS: 5.0000 mg | ORAL_TABLET | Freq: Every evening | ORAL | Status: DC | PRN

## 2024-01-02 MED ORDER — METOCLOPRAMIDE HCL 10 MG PO TABS
10.0000 mg | ORAL_TABLET | Freq: Once | ORAL | Status: AC
  Administered 2024-01-02: 10 mg via ORAL
  Filled 2024-01-02: qty 1

## 2024-01-02 MED ORDER — ASPIRIN-ACETAMINOPHEN-CAFFEINE 250-250-65 MG PO TABS
1.0000 | ORAL_TABLET | Freq: Four times a day (QID) | ORAL | Status: DC | PRN
  Administered 2024-01-02: 1 via ORAL
  Filled 2024-01-02 (×2): qty 1

## 2024-01-02 MED ORDER — ASPIRIN-ACETAMINOPHEN-CAFFEINE 250-250-65 MG PO TABS: 1.0000 | ORAL_TABLET | Freq: Four times a day (QID) | ORAL | Status: DC | PRN

## 2024-01-02 MED ORDER — ACETAMINOPHEN 500 MG PO TABS
1000.0000 mg | ORAL_TABLET | Freq: Once | ORAL | Status: AC
  Administered 2024-01-02: 1000 mg via ORAL
  Filled 2024-01-02: qty 2

## 2024-01-02 MED ORDER — DIPHENHYDRAMINE HCL 25 MG PO CAPS
25.0000 mg | ORAL_CAPSULE | Freq: Once | ORAL | Status: AC
  Administered 2024-01-02: 25 mg via ORAL
  Filled 2024-01-02: qty 1

## 2024-01-02 MED ORDER — DEXAMETHASONE 6 MG PO TABS
10.0000 mg | ORAL_TABLET | Freq: Once | ORAL | Status: AC
  Administered 2024-01-02: 10 mg via ORAL
  Filled 2024-01-02: qty 1

## 2024-01-02 MED ORDER — ACETAMINOPHEN-CAFFEINE 500-65 MG PO TABS: 1.0000 | ORAL_TABLET | Freq: Four times a day (QID) | ORAL | Status: DC | PRN

## 2024-01-02 MED ORDER — LABETALOL HCL 100 MG PO TABS
100.0000 mg | ORAL_TABLET | Freq: Two times a day (BID) | ORAL | Status: DC
  Administered 2024-01-02 – 2024-01-03 (×2): 100 mg via ORAL
  Filled 2024-01-02 (×2): qty 1

## 2024-01-02 NOTE — Plan of Care (Signed)
  Problem: Education: Goal: Knowledge of General Education information will improve Description: Including pain rating scale, medication(s)/side effects and non-pharmacologic comfort measures Outcome: Progressing   Problem: Health Behavior/Discharge Planning: Goal: Ability to manage health-related needs will improve Outcome: Progressing   Problem: Clinical Measurements: Goal: Ability to maintain clinical measurements within normal limits will improve Outcome: Progressing Goal: Will remain free from infection Outcome: Progressing Goal: Diagnostic test results will improve Outcome: Progressing Goal: Respiratory complications will improve Outcome: Progressing Goal: Cardiovascular complication will be avoided Outcome: Progressing   Problem: Activity: Goal: Risk for activity intolerance will decrease Outcome: Progressing   Problem: Nutrition: Goal: Adequate nutrition will be maintained Outcome: Progressing   Problem: Coping: Goal: Level of anxiety will decrease Outcome: Progressing   Problem: Elimination: Goal: Will not experience complications related to bowel motility Outcome: Progressing Goal: Will not experience complications related to urinary retention Outcome: Progressing   Problem: Pain Managment: Goal: General experience of comfort will improve and/or be controlled Outcome: Progressing   Problem: Safety: Goal: Ability to remain free from injury will improve Outcome: Progressing   Problem: Skin Integrity: Goal: Risk for impaired skin integrity will decrease Outcome: Progressing   Problem: Education: Goal: Knowledge of disease or condition will improve Outcome: Progressing Goal: Knowledge of the prescribed therapeutic regimen will improve Outcome: Progressing   Problem: Fluid Volume: Goal: Peripheral tissue perfusion will improve Outcome: Progressing   Problem: Clinical Measurements: Goal: Complications related to disease process, condition or treatment  will be avoided or minimized Outcome: Progressing

## 2024-01-02 NOTE — Progress Notes (Signed)
 Patient seen at beside regarding headahce. S/p tylenol  and headache cocktail with some relief. States this began and worsened with Procardia dosing increase. Denies SOB, CP, RUQ pain, LE edema. Feels neck stiffness, thinks it is from bed  FROM in cervical spine, tense paraspinal muscles and traps. FROM EOMMI.  No evidence of clonus on exam nor LE edema  BP 131/83 (BP Location: Left Arm)   Pulse 87   Temp 98.2 F (36.8 C) (Oral)   Resp 16   Ht 5' (1.524 m)   LMP 04/05/2023   SpO2 99%   BMI 31.83 kg/m   Trial of labetalol starting today at 100mg  BID, procardia to be stopped. Denies asthma history. Will titrate as needed. Advise heat pack for next and will offer flexeril  at bedtime

## 2024-01-02 NOTE — Progress Notes (Signed)
 AP PROGRESS NOTE  S: Minimal lochia, Denies SOB, CP, RUQ pain, LE edema. Endorses HA 7/10 form this AM but got minimal sleep and did nto eat breakfast. Advised hypoglycemia as well as BP being normal range may contribute to headache.   O: BP 115/69 (BP Location: Right Arm)   Pulse 91   Temp 98.3 F (36.8 C) (Oral)   Resp 16   Ht 5' (1.524 m)   LMP 04/05/2023   SpO2 100%   BMI 31.83 kg/m  Physical Exam Constitutional:      General: She is not in acute distress.    Appearance: Normal appearance.  HENT:     Head: Normocephalic and atraumatic.  Pulmonary:     Effort: Pulmonary effort is normal. No respiratory distress.    Musculoskeletal:        General: Normal range of motion.     Right lower leg: No edema.     Left lower leg: No edema.    Skin:    General: Skin is warm and dry.    Neurological:     Mental Status: She is alert.    Psychiatric:        Mood and Affect: Mood normal.        Behavior: Behavior normal.  CHR 6/13: FINDINGS: The heart size and mediastinal contours are within normal limits. Both lungs are clear. The visualized skeletal structures are unremarkable.   IMPRESSION: No active disease.  LABS: LFTs 44/34 > 30/32  Assessment/Plan: 33 yo G4P4 PPD#9 with postpartum preeclampsia without severe features - Continue [facility] admission - PreE: Severe range BP on arrival HD#1, mild range on repeat 15 minutes later. Collect 24 hour urine (will collect with clean catch given absent lochia).     *CXR WNL    *Procardia 60XL   -Transaminitis: LFTs 60/41 on 6/12 in-office, now resolved - Ibuprofen /oxycodone  PRN postop pain  Anticipate Dispo home tomorrow pending 24hr urine completion and good BP control. Formula feeding

## 2024-01-03 DIAGNOSIS — O1415 Severe pre-eclampsia, complicating the puerperium: Secondary | ICD-10-CM | POA: Diagnosis not present

## 2024-01-03 MED ORDER — LABETALOL HCL 100 MG PO TABS: 100.0000 mg | ORAL_TABLET | Freq: Two times a day (BID) | ORAL | 1 refills | Status: AC

## 2024-01-03 MED ORDER — CYCLOBENZAPRINE HCL 5 MG PO TABS: 5.0000 mg | ORAL_TABLET | Freq: Every evening | ORAL | 0 refills | Status: AC | PRN

## 2024-01-03 NOTE — Plan of Care (Signed)
  Problem: Education: Goal: Knowledge of General Education information will improve Description: Including pain rating scale, medication(s)/side effects and non-pharmacologic comfort measures Outcome: Progressing   Problem: Health Behavior/Discharge Planning: Goal: Ability to manage health-related needs will improve Outcome: Progressing   Problem: Clinical Measurements: Goal: Ability to maintain clinical measurements within normal limits will improve Outcome: Progressing Goal: Will remain free from infection Outcome: Progressing Goal: Diagnostic test results will improve Outcome: Progressing Goal: Cardiovascular complication will be avoided Outcome: Progressing   Problem: Education: Goal: Knowledge of disease or condition will improve Outcome: Progressing Goal: Knowledge of the prescribed therapeutic regimen will improve Outcome: Progressing   Problem: Fluid Volume: Goal: Peripheral tissue perfusion will improve Outcome: Progressing   Problem: Clinical Measurements: Goal: Complications related to disease process, condition or treatment will be avoided or minimized Outcome: Progressing

## 2024-01-03 NOTE — Progress Notes (Signed)
 AP PROGRESS NOTE  S: Minimal lochia, Denies SOB, CP, RUQ pain, LE edema. Feels significantly improved after switching BP med to labetalol BID and adding PRN flexeril , had improved sleep. No HA this morning! Normotensive since BP med change  O: BP 130/76 (BP Location: Right Arm)   Pulse 86   Temp 98.4 F (36.9 C) (Oral)   Resp 16   Ht 5' (1.524 m)   LMP 04/05/2023   SpO2 100%   BMI 31.83 kg/m  Physical Exam Constitutional:      General: She is not in acute distress.    Appearance: Normal appearance.  HENT:     Head: Normocephalic and atraumatic.  Pulmonary:     Effort: Pulmonary effort is normal. No respiratory distress.    Musculoskeletal:        General: Normal range of motion.     Right lower leg: No edema.     Left lower leg: No edema.    Skin:    General: Skin is warm and dry.    Neurological:     Mental Status: She is alert.    Psychiatric:        Mood and Affect: Mood normal.        Behavior: Behavior normal.  CHR 6/13: FINDINGS: The heart size and mediastinal contours are within normal limits. Both lungs are clear. The visualized skeletal structures are unremarkable.   IMPRESSION: No active disease.  LABS: LFTs 44/34 > 30/32 24hr urine protein 184 (>300)  Assessment/Plan: 33 yo G4P4 PPD#10 with postpartum preeclampsia without severe features - Continue [facility] admission - PreE: Severe range BP on arrival HD#1, mild range on repeat 15 minutes later. S/p 24hr urine protein collection (will collect with clean catch given absent lochia), WNL    *CXR WNL    *Procardia 60XL switched to labetalol 100mg  BID with improved HA and good BP control. DC home on new meds and plan for BP check this week -Transaminitis: LFTs 60/41 on 6/12 in-office, now resolved - Ibuprofen /oxycodone  PRN postop pain  DC home today on labetalol 100mg  BID with at bedtime flexeril  PRN. BP check this week

## 2024-01-03 NOTE — Discharge Summary (Signed)
 Physician Discharge Summary  Patient ID: Paula Patton MRN: [id number] DOB/AGE: Nov 24, 1990 32 y.o.  Admit date: 01/01/2024 Discharge date: 01/03/2024  Admission Diagnoses:  Discharge Diagnoses:  Principal Problem:   Preeclampsia in postpartum period   Discharged Condition: good  Hospital Course: Patient admitted PPD#8 for postpartum PreE without severe features. Initially prescribed procardia 30XL for BP control which was increased to 60XL. However, pt with increased HA 2/2 CCB. Medication switched to labetalol 100mg  BID; HA resolved, normotensive and pt able to rest overnight. DC home HD#3 with plan for BP check this week. Patient asymptomatic from PreE standpoint on discharge. CXR neg for edema, 24hr urine protein WNL.   Significant Diagnostic Studies: CXR, 24hr urine protein   Discharge Exam: Blood pressure 130/76, pulse 86, temperature 98.4 F (36.9 C), temperature source Oral, resp. rate 16, height 5' (1.524 m), last menstrual period 04/05/2023, SpO2 100%, unknown if currently breastfeeding. Constitutional:      General: She is not in acute distress.    Appearance: Normal appearance.  HENT:     Head: Normocephalic and atraumatic.  Pulmonary:     Effort: Pulmonary effort is normal. No respiratory distress.    Musculoskeletal:        General: Normal range of motion.     Right lower leg: No edema.     Left lower leg: No edema.    Skin:    General: Skin is warm and dry.    Neurological:     Mental Status: She is alert.    Psychiatric:        Mood and Affect: Mood normal.        Behavior: Behavior normal.  Disposition: Discharge disposition: 01-Home or Self Care       Discharge Instructions     Activity as tolerated   Complete by: As directed    Call MD for:  difficulty breathing, headache or visual disturbances   Complete by: As directed    Call MD for:  hives   Complete by: As directed    Call MD for:  persistant dizziness or light-headedness    Complete by: As directed    Call MD for:  persistant nausea and vomiting   Complete by: As directed    Call MD for:  redness, tenderness, or signs of infection (pain, swelling, redness, odor or green/yellow discharge around incision site)   Complete by: As directed    Call MD for:  severe uncontrolled pain   Complete by: As directed    Call MD for:  temperature >100.4   Complete by: As directed    Diet - low sodium heart healthy   Complete by: As directed    Lifting restrictions   Complete by: As directed    Weight restriction of 15 lbs.   No wound care   Complete by: As directed    Sexual activity   Complete by: As directed    None until 6wks      Allergies as of 01/03/2024   No Known Allergies      Medication List     TAKE these medications    cyclobenzaprine  5 MG tablet Commonly known as: FLEXERIL  Take 1 tablet (5 mg total) by mouth at bedtime as needed for muscle spasms.   ibuprofen  800 MG tablet Commonly known as: ADVIL  Take 1 tablet (800 mg total) by mouth every 8 (eight) hours as needed.   labetalol 100 MG tablet Commonly known as: NORMODYNE Take 1 tablet (100 mg total) by mouth  2 (two) times daily.   multivitamin-prenatal 27-0.8 MG Tabs tablet Take 1 tablet by mouth daily at 12 noon.   oxyCODONE  5 MG immediate release tablet Commonly known as: Roxicodone  Take 1 tablet (5 mg total) by mouth every 4 (four) hours as needed.         Signed: Martin Slay Paula Patton 01/03/2024, 11:23 AM

## 2024-01-03 NOTE — Plan of Care (Signed)
  Problem: Education: Goal: Knowledge of General Education information will improve Description: Including pain rating scale, medication(s)/side effects and non-pharmacologic comfort measures 01/03/2024 1603 by Cathryne Cobble, RN Outcome: Completed/Met 01/03/2024 0848 by Cathryne Cobble, RN Outcome: Progressing   Problem: Health Behavior/Discharge Planning: Goal: Ability to manage health-related needs will improve 01/03/2024 1603 by Cathryne Cobble, RN Outcome: Completed/Met 01/03/2024 0848 by Cathryne Cobble, RN Outcome: Progressing   Problem: Clinical Measurements: Goal: Ability to maintain clinical measurements within normal limits will improve 01/03/2024 1603 by Cathryne Cobble, RN Outcome: Completed/Met 01/03/2024 0848 by Cathryne Cobble, RN Outcome: Progressing Goal: Will remain free from infection 01/03/2024 1603 by Cathryne Cobble, RN Outcome: Completed/Met 01/03/2024 0848 by Cathryne Cobble, RN Outcome: Progressing Goal: Diagnostic test results will improve 01/03/2024 1603 by Cathryne Cobble, RN Outcome: Completed/Met 01/03/2024 0848 by Cathryne Cobble, RN Outcome: Progressing Goal: Cardiovascular complication will be avoided 01/03/2024 1603 by Cathryne Cobble, RN Outcome: Completed/Met 01/03/2024 0848 by Cathryne Cobble, RN Outcome: Progressing   Problem: Activity: Goal: Risk for activity intolerance will decrease Outcome: Completed/Met   Problem: Nutrition: Goal: Adequate nutrition will be maintained Outcome: Completed/Met   Problem: Coping: Goal: Level of anxiety will decrease Outcome: Completed/Met   Problem: Elimination: Goal: Will not experience complications related to bowel motility Outcome: Completed/Met   Problem: Pain Managment: Goal: General experience of comfort will improve and/or be controlled Outcome: Completed/Met   Problem: Safety: Goal: Ability to remain free from injury will improve Outcome:  Completed/Met   Problem: Skin Integrity: Goal: Risk for impaired skin integrity will decrease Outcome: Completed/Met   Problem: Education: Goal: Knowledge of disease or condition will improve 01/03/2024 1603 by Cathryne Cobble, RN Outcome: Completed/Met 01/03/2024 0848 by Cathryne Cobble, RN Outcome: Progressing Goal: Knowledge of the prescribed therapeutic regimen will improve 01/03/2024 1603 by Cathryne Cobble, RN Outcome: Completed/Met 01/03/2024 0848 by Cathryne Cobble, RN Outcome: Progressing   Problem: Fluid Volume: Goal: Peripheral tissue perfusion will improve 01/03/2024 1603 by Cathryne Cobble, RN Outcome: Completed/Met 01/03/2024 0848 by Cathryne Cobble, RN Outcome: Progressing   Problem: Clinical Measurements: Goal: Complications related to disease process, condition or treatment will be avoided or minimized 01/03/2024 1603 by Cathryne Cobble, RN Outcome: Completed/Met 01/03/2024 0848 by Cathryne Cobble, RN Outcome: Progressing

## 2024-01-06 ENCOUNTER — Telehealth (HOSPITAL_COMMUNITY): Payer: Self-pay | Admitting: *Deleted

## 2024-01-06 NOTE — Telephone Encounter (Signed)
 01/06/2024  Name: Paula Patton MRN: [id number] DOB: Jan 13, 1991  Reason for Call:  Transition of Care Hospital Discharge Call  Contact Status: Patient Contact Status: Complete  Language assistant needed: Interpreter Mode: Interpreter Not Needed        Follow-Up Questions: Do You Have Any Concerns About Your Health As You Heal From Delivery?: No Do You Have Any Concerns About Your Infants Health?: No  Edinburgh Postnatal Depression Scale:  In the Past 7 Days:    PHQ2-9 Depression Scale:     Discharge Follow-up: Edinburgh score requires follow up?:  (declines screening today, endorses she is doing well emotionally) Patient was advised of the following resources:: Support Group  Post-discharge interventions: Reviewed Newborn Safe Sleep Practices  Pearlie Bougie, RN 01/06/2024 16:05

## 2024-01-30 DIAGNOSIS — Z419 Encounter for procedure for purposes other than remedying health state, unspecified: Secondary | ICD-10-CM | POA: Diagnosis not present
